# Patient Record
Sex: Female | Born: 1935 | Race: White | Hispanic: No
Health system: Southern US, Community
[De-identification: ages and names within clinical notes are randomized; demographics above are authoritative.]

## PROBLEM LIST (undated history)

## (undated) DIAGNOSIS — E119 Type 2 diabetes mellitus without complications: Secondary | ICD-10-CM

## (undated) DIAGNOSIS — I10 Essential (primary) hypertension: Secondary | ICD-10-CM

---

## 2021-07-15 ENCOUNTER — Inpatient Hospital Stay (HOSPITAL_COMMUNITY)
Admission: EM | Admit: 2021-07-15 | Discharge: 2021-07-19 | DRG: 682 | Disposition: A | Discharge status: Nursing Home (Includes ICF) | Payer: Medicare Other | Source: Skilled Nursing Facility | Attending: Internal Medicine | Admitting: Internal Medicine

## 2021-07-15 ENCOUNTER — Emergency Department (HOSPITAL_COMMUNITY): Payer: Medicare Other

## 2021-07-15 ENCOUNTER — Other Ambulatory Visit: Payer: Self-pay

## 2021-07-15 ENCOUNTER — Encounter (HOSPITAL_COMMUNITY): Payer: Self-pay | Admitting: Emergency Medicine

## 2021-07-15 DIAGNOSIS — I5032 Chronic diastolic (congestive) heart failure: Secondary | ICD-10-CM | POA: Diagnosis present

## 2021-07-15 DIAGNOSIS — F32A Depression, unspecified: Secondary | ICD-10-CM | POA: Diagnosis present

## 2021-07-15 DIAGNOSIS — N179 Acute kidney failure, unspecified: Secondary | ICD-10-CM | POA: Diagnosis present

## 2021-07-15 DIAGNOSIS — I872 Venous insufficiency (chronic) (peripheral): Secondary | ICD-10-CM | POA: Diagnosis present

## 2021-07-15 DIAGNOSIS — Z20822 Contact with and (suspected) exposure to covid-19: Secondary | ICD-10-CM | POA: Diagnosis present

## 2021-07-15 DIAGNOSIS — H9190 Unspecified hearing loss, unspecified ear: Secondary | ICD-10-CM | POA: Diagnosis present

## 2021-07-15 DIAGNOSIS — I130 Hypertensive heart and chronic kidney disease with heart failure and stage 1 through stage 4 chronic kidney disease, or unspecified chronic kidney disease: Secondary | ICD-10-CM | POA: Diagnosis present

## 2021-07-15 DIAGNOSIS — F419 Anxiety disorder, unspecified: Secondary | ICD-10-CM | POA: Diagnosis present

## 2021-07-15 DIAGNOSIS — I4819 Other persistent atrial fibrillation: Secondary | ICD-10-CM | POA: Diagnosis present

## 2021-07-15 DIAGNOSIS — E1122 Type 2 diabetes mellitus with diabetic chronic kidney disease: Secondary | ICD-10-CM | POA: Diagnosis present

## 2021-07-15 DIAGNOSIS — B961 Klebsiella pneumoniae [K. pneumoniae] as the cause of diseases classified elsewhere: Secondary | ICD-10-CM | POA: Diagnosis present

## 2021-07-15 DIAGNOSIS — Z993 Dependence on wheelchair: Secondary | ICD-10-CM | POA: Diagnosis not present

## 2021-07-15 DIAGNOSIS — E8809 Other disorders of plasma-protein metabolism, not elsewhere classified: Secondary | ICD-10-CM | POA: Diagnosis present

## 2021-07-15 DIAGNOSIS — G9341 Metabolic encephalopathy: Secondary | ICD-10-CM | POA: Diagnosis present

## 2021-07-15 DIAGNOSIS — D631 Anemia in chronic kidney disease: Secondary | ICD-10-CM | POA: Diagnosis present

## 2021-07-15 DIAGNOSIS — D696 Thrombocytopenia, unspecified: Secondary | ICD-10-CM | POA: Diagnosis present

## 2021-07-15 DIAGNOSIS — R531 Weakness: Secondary | ICD-10-CM | POA: Diagnosis present

## 2021-07-15 DIAGNOSIS — Z79899 Other long term (current) drug therapy: Secondary | ICD-10-CM | POA: Diagnosis not present

## 2021-07-15 DIAGNOSIS — N189 Chronic kidney disease, unspecified: Secondary | ICD-10-CM | POA: Diagnosis present

## 2021-07-15 DIAGNOSIS — I4891 Unspecified atrial fibrillation: Secondary | ICD-10-CM | POA: Diagnosis not present

## 2021-07-15 DIAGNOSIS — N390 Urinary tract infection, site not specified: Secondary | ICD-10-CM | POA: Diagnosis present

## 2021-07-15 DIAGNOSIS — R778 Other specified abnormalities of plasma proteins: Principal | ICD-10-CM

## 2021-07-15 HISTORY — DX: Type 2 diabetes mellitus without complications: E11.9

## 2021-07-15 HISTORY — DX: Essential (primary) hypertension: I10

## 2021-07-15 LAB — URINALYSIS, ROUTINE W REFLEX MICROSCOPIC
Bilirubin Urine: NEGATIVE
Glucose, UA: NEGATIVE mg/dL
Ketones, ur: NEGATIVE mg/dL
Nitrite: NEGATIVE
Protein, ur: NEGATIVE mg/dL
Specific Gravity, Urine: 1.008 (ref 1.005–1.030)
WBC, UA: >50 WBC/hpf — ABNORMAL HIGH (ref 0–5)
pH: 7 (ref 5.0–8.0)

## 2021-07-15 LAB — CBC WITH DIFFERENTIAL/PLATELET
Abs Immature Granulocytes: 0.04 10*3/uL (ref 0.00–0.07)
Basophils Absolute: 0.1 10*3/uL (ref 0.0–0.1)
Basophils Relative: 1 %
Eosinophils Absolute: 0.2 10*3/uL (ref 0.0–0.5)
Eosinophils Relative: 2 %
HCT: 34.5 % — ABNORMAL LOW (ref 36.0–46.0)
Hemoglobin: 11.2 g/dL — ABNORMAL LOW (ref 12.0–15.0)
Immature Granulocytes: 1 %
Lymphocytes Relative: 15 %
Lymphs Abs: 1.2 10*3/uL (ref 0.7–4.0)
MCH: 28 pg (ref 26.0–34.0)
MCHC: 32.5 g/dL (ref 30.0–36.0)
MCV: 86.3 fL (ref 80.0–100.0)
Monocytes Absolute: 0.8 10*3/uL (ref 0.1–1.0)
Monocytes Relative: 10 %
Neutro Abs: 6.2 10*3/uL (ref 1.7–7.7)
Neutrophils Relative %: 71 %
Platelets: 145 10*3/uL — ABNORMAL LOW (ref 150–400)
RBC: 4 MIL/uL (ref 3.87–5.11)
RDW: 14 % (ref 11.5–15.5)
WBC: 8.4 10*3/uL (ref 4.0–10.5)
nRBC: 0 % (ref 0.0–0.2)

## 2021-07-15 LAB — TROPONIN I (HIGH SENSITIVITY)
Troponin I (High Sensitivity): 49 ng/L — ABNORMAL HIGH (ref <18)
Troponin I (High Sensitivity): 47 ng/L — ABNORMAL HIGH (ref <18)

## 2021-07-15 LAB — CBG MONITORING, ED: Glucose-Capillary: 170 mg/dL — ABNORMAL HIGH (ref 70–99)

## 2021-07-15 LAB — COMPREHENSIVE METABOLIC PANEL
ALT: 11 U/L (ref 0–44)
AST: 15 U/L (ref 15–41)
Albumin: 2.9 g/dL — ABNORMAL LOW (ref 3.5–5.0)
Alkaline Phosphatase: 67 U/L (ref 38–126)
Anion gap: 13 (ref 5–15)
BUN: 80 mg/dL — ABNORMAL HIGH (ref 8–23)
CO2: 23 mmol/L (ref 22–32)
Calcium: 10.1 mg/dL (ref 8.9–10.3)
Chloride: 101 mmol/L (ref 98–111)
Creatinine, Ser: 4.35 mg/dL — ABNORMAL HIGH (ref 0.44–1.00)
GFR, Estimated: 9 mL/min — ABNORMAL LOW (ref >60)
Glucose, Bld: 243 mg/dL — ABNORMAL HIGH (ref 70–99)
Potassium: 4.4 mmol/L (ref 3.5–5.1)
Sodium: 137 mmol/L (ref 135–145)
Total Bilirubin: 1.2 mg/dL (ref 0.3–1.2)
Total Protein: 6.1 g/dL — ABNORMAL LOW (ref 6.5–8.1)

## 2021-07-15 LAB — HEMOGLOBIN A1C
Hgb A1c MFr Bld: 7.4 % — ABNORMAL HIGH (ref 4.8–5.6)
Mean Plasma Glucose: 165.68 mg/dL

## 2021-07-15 LAB — LIPASE, BLOOD: Lipase: 29 U/L (ref 11–51)

## 2021-07-15 LAB — SARS CORONAVIRUS 2 BY RT PCR: SARS Coronavirus 2 by RT PCR: NEGATIVE

## 2021-07-15 LAB — LACTIC ACID, PLASMA: Lactic Acid, Venous: 1.8 mmol/L (ref 0.5–1.9)

## 2021-07-15 LAB — GLUCOSE, CAPILLARY: Glucose-Capillary: 154 mg/dL — ABNORMAL HIGH (ref 70–99)

## 2021-07-15 MED ORDER — INSULIN ASPART 100 UNIT/ML IJ SOLN: 0.0000 [IU] | Freq: Three times a day (TID) | INTRAMUSCULAR | Status: DC

## 2021-07-15 MED ORDER — INSULIN ASPART 100 UNIT/ML IJ SOLN
0.0000 [IU] | INTRAMUSCULAR | Status: DC
  Administered 2021-07-15 – 2021-07-16 (×4): 1 [IU] via SUBCUTANEOUS
  Administered 2021-07-17: 3 [IU] via SUBCUTANEOUS
  Administered 2021-07-17 (×3): 1 [IU] via SUBCUTANEOUS
  Administered 2021-07-18: 2 [IU] via SUBCUTANEOUS
  Administered 2021-07-18 – 2021-07-19 (×6): 1 [IU] via SUBCUTANEOUS

## 2021-07-15 MED ORDER — SODIUM CHLORIDE 0.9 % IV BOLUS
500.0000 mL | Freq: Once | INTRAVENOUS | Status: AC
  Administered 2021-07-15: 500 mL via INTRAVENOUS

## 2021-07-15 NOTE — ED Notes (Signed)
Unable to obtain UA at this time. Patient in hallway and incontinent of bladder.

## 2021-07-15 NOTE — H&P (Cosign Needed)
Date: 07/16/2021               Patient Name:  Laura George MRN: [id number]  DOB: July 01, 1925 Age / Sex: 88 y.o., female   PCP: Aviva Kluver, FNP         Medical Service: Internal Medicine Teaching Service         Attending Physician: Dr. Reymundo Poll, MD    First Contact: Rudene Christians, DO Pager: [phone]  Second Contact: Salena Saner, MD Pager: Turner Daniels [phone]       After Hours (After 5p/  First Contact Pager: [phone]  weekends / holidays): Second Contact Pager: [phone]   SUBJECTIVE  Chief Complaint: generalized weakness  History of Present Illness: Laura George is a 88 y.o. female with a pertinent PMH of Heart failure, hypertension, type 2 diabetes mellitus, who presents to [facility] with increase confusion and weakness.  Patient unable to answer questions. History obtained from [facility]. Unable to reach son, Laura George, but updated phone number listed in chart.  On Saturday she started have increased weakness and confusion. She was not able to feed herself. At baseline she is able to stand and walk a few steps, but is mostly wheelchair bound.   In the ED, the patient was normotensive with BP of 119/57 with MAP of 74, temperature at 97.5.  BMP showed creatinine of 4.35 with K of 4.4, BUN of 80. No leukocytosis on CBC. CT head negative for acute intracranial abnormalities.  Medications: Furosemide 40 mg BID Sertraline 25 mg qd Spironolactone 50 mg Ondansetron 8 mg q 8 hrs prn   Past Medical History:  Past Medical History:  Diagnosis Date   Diabetes mellitus without complication (HCC)    Hypertension     Social:  Lives - [facility] Support - Son, Laura George Level of function - feeds herself Substance use - non-smoker  Family History: History reviewed. No pertinent family history.  Allergies: Allergies as of 07/15/2021   (No Known Allergies)    Review of Systems: A complete ROS was negative except as per HPI.   OBJECTIVE:  Physical Exam: Blood  pressure 114/65, pulse 80, temperature 97.8 F (36.6 C), temperature source Oral, resp. rate 18, height 5\' 6"  (1.676 m), weight 63.7 kg, SpO2 98 %. Constitutional: confused, laying in bed Cardiovascular: irregularly irregular, no m/r/g Pulmonary/Chest: clear to auscultation bilaterally, normal work of breathing on room air Abdominal: decreased bowel sounds, no distension, no tenderness MSK: chronic venous stasis changes to lower extremities bilaterally, laceration below knees bilaterally from compression hose, 1+ non pitting edema to lower extremities bilaterally, extremities are warm and dry Neurological: oriented to self only Psych: hard of hearing  Pertinent Labs: CBC    Component Value Date/Time   WBC 7.0 07/16/2021 0333   RBC 4.13 07/16/2021 0333   HGB 11.4 (L) 07/16/2021 0333   HCT 35.8 (L) 07/16/2021 0333   PLT 147 (L) 07/16/2021 0333   MCV 86.7 07/16/2021 0333   MCH 27.6 07/16/2021 0333   MCHC 31.8 07/16/2021 0333   RDW 13.9 07/16/2021 0333   LYMPHSABS 1.2 07/15/2021 1440   MONOABS 0.8 07/15/2021 1440   EOSABS 0.2 07/15/2021 1440   BASOSABS 0.1 07/15/2021 1440     CMP     Component Value Date/Time   NA 138 07/16/2021 0333   K 4.7 07/16/2021 0333   CL 102 07/16/2021 0333   CO2 22 07/16/2021 0333   GLUCOSE 128 (H) 07/16/2021 0333   BUN 78 (H) 07/16/2021 07/18/2021  CREATININE 3.93 (H) 07/16/2021 0333   CALCIUM 10.2 07/16/2021 0333   PROT 6.1 (L) 07/15/2021 1440   ALBUMIN 2.9 (L) 07/15/2021 1440   AST 15 07/15/2021 1440   ALT 11 07/15/2021 1440   ALKPHOS 67 07/15/2021 1440   BILITOT 1.2 07/15/2021 1440   GFRNONAA 10 (L) 07/16/2021 0333    Pertinent Imaging: ECHOCARDIOGRAM COMPLETE  Result Date: 07/16/2021    ECHOCARDIOGRAM REPORT   Patient Name:   Laura George Date of Exam: 07/16/2021 Medical Rec #:  [id number]     Height:       66.0 in Accession #:    [id number]    Weight:       140.4 lb Date of Birth:  10-15-1925     BSA:          1.721 m Patient Age:    86 years       BP:           114/65 mmHg Patient Gender: F             HR:           75 bpm. Exam Location:  Inpatient Procedure: 2D Echo, Cardiac Doppler, Color Doppler and Intracardiac            Opacification Agent Indications:    Atrial fibrillation  History:        Patient has no prior history of Echocardiogram examinations.                 Risk Factors:Hypertension and Diabetes.  Sonographer:    Clayton Lefort RDCS (AE) Referring Phys: [id number] Velna Ochs  Sonographer Comments: Image acquisition challenging due to respiratory motion. IMPRESSIONS  1. Left ventricular ejection fraction, by estimation, is 50 to 55%. The left ventricle has low normal function. The left ventricle demonstrates regional wall motion abnormalities (abnormal septal motion). There is mild concentric left ventricular hypertrophy. Left ventricular diastolic function could not be evaluated.  2. Right ventricular systolic function is normal. The right ventricular size is normal. There is normal pulmonary artery systolic pressure.  3. The mitral valve is grossly normal. No evidence of mitral valve regurgitation. The mean mitral valve gradient is 3.0 mmHg with average heart rate of 91 bpm.  4. The aortic valve was not well visualized. Aortic valve regurgitation is not visualized. No aortic stenosis is present.  5. The inferior vena cava is normal in size with greater than 50% respiratory variability, suggesting right atrial pressure of 3 mmHg. Comparison(s): No prior Echocardiogram. FINDINGS  Left Ventricle: Left ventricular ejection fraction, by estimation, is 50 to 55%. The left ventricle has low normal function. The left ventricle demonstrates regional wall motion abnormalities. Definity contrast agent was given IV to delineate the left ventricular endocardial borders. The left ventricular internal cavity size was small. There is mild concentric left ventricular hypertrophy. Left ventricular diastolic function could not be evaluated due to atrial  fibrillation. Left ventricular diastolic  function could not be evaluated. Right Ventricle: The right ventricular size is normal. Right vetricular wall thickness was not well visualized. Right ventricular systolic function is normal. There is normal pulmonary artery systolic pressure. The tricuspid regurgitant velocity is 2.39 m/s, and with an assumed right atrial pressure of 3 mmHg, the estimated right ventricular systolic pressure is 0000000 mmHg. Left Atrium: Left atrial size was normal in size. Right Atrium: Right atrial size was normal in size. Pericardium: Trivial pericardial effusion is present. Presence of epicardial fat layer. Mitral Valve: The mitral valve is  grossly normal. No evidence of mitral valve regurgitation. The mean mitral valve gradient is 3.0 mmHg with average heart rate of 91 bpm. Tricuspid Valve: The tricuspid valve is not well visualized. Tricuspid valve regurgitation is trivial. Aortic Valve: The aortic valve was not well visualized. Aortic valve regurgitation is not visualized. No aortic stenosis is present. Aortic valve mean gradient measures 2.0 mmHg. Aortic valve peak gradient measures 3.6 mmHg. Aortic valve area, by VTI measures 2.32 cm. Pulmonic Valve: The pulmonic valve was not well visualized. Pulmonic valve regurgitation is not visualized. Aorta: The aortic root and ascending aorta are structurally normal, with no evidence of dilitation. Venous: The inferior vena cava is normal in size with greater than 50% respiratory variability, suggesting right atrial pressure of 3 mmHg. IAS/Shunts: The atrial septum is grossly normal.  LEFT VENTRICLE PLAX 2D LVIDd:         3.50 cm LVIDs:         2.40 cm LV PW:         1.20 cm LV IVS:        1.30 cm LVOT diam:     1.90 cm LV SV:         45 LV SV Index:   26 LVOT Area:     2.84 cm  LV Volumes (MOD) LV vol d, MOD A2C: 63.1 ml LV vol d, MOD A4C: 64.3 ml LV vol s, MOD A2C: 24.3 ml LV vol s, MOD A4C: 38.7 ml LV SV MOD A2C:     38.8 ml LV SV MOD A4C:      64.3 ml LV SV MOD BP:      33.2 ml RIGHT VENTRICLE            IVC RV Basal diam:  2.70 cm    IVC diam: 1.80 cm RV S prime:     8.27 cm/s TAPSE (M-mode): 1.3 cm LEFT ATRIUM             Index        RIGHT ATRIUM           Index LA diam:        2.80 cm 1.63 cm/m   RA Area:     12.40 cm LA Vol (A2C):   15.0 ml 8.72 ml/m   RA Volume:   26.00 ml  15.11 ml/m LA Vol (A4C):   37.4 ml 21.74 ml/m LA Biplane Vol: 23.4 ml 13.60 ml/m  AORTIC VALVE AV Area (Vmax):    2.57 cm AV Area (Vmean):   2.71 cm AV Area (VTI):     2.32 cm AV Vmax:           94.50 cm/s AV Vmean:          65.500 cm/s AV VTI:            0.193 m AV Peak Grad:      3.6 mmHg AV Mean Grad:      2.0 mmHg LVOT Vmax:         85.70 cm/s LVOT Vmean:        62.600 cm/s LVOT VTI:          0.158 m LVOT/AV VTI ratio: 0.82  AORTA Ao Root diam: 2.90 cm Ao Asc diam:  2.60 cm MITRAL VALVE           TRICUSPID VALVE MV Mean grad: 3.0 mmHg TR Peak grad:   22.8 mmHg  TR Vmax:        239.00 cm/s                         SHUNTS                        Systemic VTI:  0.16 m                        Systemic Diam: 1.90 cm Rudean Haskell MD Electronically signed by Rudean Haskell MD Signature Date/Time: 07/16/2021/12:10:31 PM    Final    US RENAL  Result Date: 07/15/2021 CLINICAL DATA:  Acute kidney failure. EXAM: RENAL / URINARY TRACT ULTRASOUND COMPLETE COMPARISON:  None Available. FINDINGS: Right Kidney: Renal measurements: 8 x 4.6 x 4 5 cm = volume: 87 mL. Diffuse increased echotexture of the kidney. No mass or hydronephrosis visualized. Perinephric fluid is noted. Left Kidney: Renal measurements: 9.4 x 5 x 4.7 cm = volume: 115 mL. Diffuse increased echotexture of the kidney. No hydronephrosis visualized. There are less than 10 simple cysts identified in the left kidney, largest measures 2.6 x 2.2 x 2.7 cm in the lower pole left kidney. No follow-up is necessary. Perinephric fluid is noted. Bladder: Appears normal for degree of bladder  distention. Other: None. IMPRESSION: No acute abnormality. No hydronephrosis bilaterally. Diffuse increased echotexture of the kidney this can be seen in medical renal disease. Electronically Signed   By: Abelardo Diesel M.D.   On: 07/15/2021 17:22    EKG: personally reviewed my interpretation is irregularly irregular, LAD with LBBB and J point elevation in lead V2 and 3  ASSESSMENT & PLAN:  Assessment: Principal Problem:   AKI (acute kidney injury) ([place]) Active Problems:   Acute metabolic encephalopathy   Laura George is a 88 y.o. with pertinent PMH of type 2 diabetes mellitus, hypertension, heart failure, and depression/ anxiety who presented with generalized weakness and admit for AKI on hospital day 1  Plan:  Acute metabolic encephalopathy Uremia Patient presents with increased confusion. At baseline she is oriented to self, place, able to feed oneself, and is able to hold a conversation. BMP showed creatinine of 4.35 with BUN of 80 with anion gap of 13. Blood sugar elevated at 243. Uremia vs hyperglycemia could be contributing to increased confusion. Patient is not able to convey symptoms, it is possible that she has a bladder infection, but has not been febrile here and does not have leukocytosis. CT head was negative for acute intracranial process but did show chronic small vessel ischemic disease and brain atrophy. The only centrally acting medication on her list is sertraline 25 mg. I think that encephalopathy is likely multifactorial in setting of uremia and possible urinary retention. -bladder scan -UA -hold sertraline -delirium precautions  AKI on CKD, unknown baseline Patient presenting with several day history of generalized weakness and confusion. On exam, she appears euvolemic. Labs showed creatinine of 4.35 with BUN of 80 with anion gap of 13. GFR of 9. Renal ultrasound showed no hydronephrosis bilaterally with diffuse increased echotexture consistent with chronic  disease. -trend BMP -UA pending -avoid nephrotoxins - if post void residual >400 then I and O.  Atrial fibrillation with controlled rates Elevated troponin EKG showed atrial fibrillation with left axis deviation and LBBB. Troponin 49->47. CHADVASC score of 6 points, HASBLED score of 2. -echo pending -telemetry -consider starting on anticoagulation  Heart failure with unknown EF Home  medications include furosemide 40 mg BID and spironolactone. I think that she likely has diastolic dysfunction, but will check echo. She appears euvolemic on exam.  -daily weights -strict I and Os  Normocytic anemia Hgb at 11.2 with MCV of 86.3 -iron studies -ferritin  Thrombocytopenia, mild Platelets at 145.  -trend CBC  Type 2 diabetes mellitus Glucose elevated at 243, anion gap of 13. I think that anion gap is likely related to uremia. -hgba1c pending -SSI, very sensitive  Hypoalbuminemia Albumin of 2.9.  Hypertension Home medication include spironolactone 50 mg qd. Blood pressure is normotensive. Will hold BP medications for now.  Anxiety/ depression Home medication includes sertraline 25 mg qd. -Holding in setting of AKI and confusion  Chronic venous insufficiency Physical exam showed chronic venous stasis changes. Compression stocking appears to have caused a pressure injury to area below knees bilaterally. -monitor for signs of infection at pressure injury  Best Practice: Diet: Diabetic diet IVF: Fluids: none VTE: apixaban (ELIQUIS) tablet 2.5 mg Start: 07/16/21 2200 SCDs Start: 07/15/21 1644 Code: Full AB: none Status: Inpatient with expected length of stay greater than 2 midnights. Anticipated Discharge Location: pending Barriers to Discharge: improvement in AKI and confusion  Signature: Laren Whaling M. Ricard Faulkner, D.O.  Internal Medicine Resident, PGY-1 [facility]  Pager: [phone] 3:56 PM, 07/16/2021   Please contact the on call pager after 5  pm and on weekends at [phone].

## 2021-07-15 NOTE — ED Notes (Signed)
ED TO INPATIENT HANDOFF REPORT  ED Nurse Name and Phone #: [phone] [facility] RN  S Name/Age/Gender Laura George 88 y.o. female Room/Bed: 020C/020C  Code Status   Code Status: Full Code  Home/SNF/Other Nursing Home Patient oriented to: self and place Is this baseline? Yes   Triage Complete: Triage complete  Chief Complaint AKI (acute kidney injury) (HCC) [N17.9]  Triage Note Pt BIB [facility] from [facility] for generalized weakness x2 days. Staff reports she has had more issues getting around recently. Pt denies complaints. 12 lead shows afib/aflutter, no hx of same. EMS reports pts mental status is at baseline, pt A/O x2 on assessment, oriented to person and place.  128/60, HR 76, 95% RA, CBG 186   Allergies Not on File  Level of Care/Admitting Diagnosis ED Disposition     ED Disposition  Admit   Condition  --   Comment  Hospital Area: [facility] [[id number]]  Level of Care: Telemetry Medical [[id number]]  May admit patient to [facility] or [facility] if equivalent level of care is available:: No  Covid Evaluation: Asymptomatic - no recent exposure (last 10 days) testing not required  Diagnosis: AKI (acute kidney injury) [facility]) [[id number]]  Admitting Physician: Reymundo Poll [[id number]]  Attending Physician: Reymundo Poll [[id number]]  Estimated length of stay: past midnight tomorrow  Certification:: I certify this patient will need inpatient services for at least 2 midnights          B Medical/Surgery History Past Medical History:  Diagnosis Date   Diabetes mellitus without complication (HCC)    Hypertension    History reviewed. No pertinent surgical history.   A IV Location/Drains/Wounds Patient Lines/Drains/Airways Status     Active Line/Drains/Airways     Name Placement date Placement time Site Days   Peripheral IV 07/15/21 20 G Left Antecubital 07/15/21  1604  Antecubital  less than 1            Intake/Output Last 24 hours No  intake or output data in the 24 hours ending 07/15/21 2055  Labs/Imaging Results for orders placed or performed during the hospital encounter of 07/15/21 (from the past 48 hour(s))  SARS Coronavirus 2 by RT PCR (hospital order, performed in [facility] hospital lab) *cepheid single result test* Anterior Nasal Swab     Status: None   Collection Time: 07/15/21 12:59 PM   Specimen: Anterior Nasal Swab  Result Value Ref Range   SARS Coronavirus 2 by RT PCR NEGATIVE NEGATIVE    Comment: (NOTE) SARS-CoV-2 target nucleic acids are NOT DETECTED.  The SARS-CoV-2 RNA is generally detectable in upper and lower respiratory specimens during the acute phase of infection. The lowest concentration of SARS-CoV-2 viral copies this assay can detect is 250 copies / mL. A negative result does not preclude SARS-CoV-2 infection and should not be used as the sole basis for treatment or other patient management decisions.  A negative result may occur with improper specimen collection / handling, submission of specimen other than nasopharyngeal swab, presence of viral mutation(s) within the areas targeted by this assay, and inadequate number of viral copies (<250 copies / mL). A negative result must be combined with clinical observations, patient history, and epidemiological information.  Fact Sheet for Patients:   RoadLapTop.co.za  Fact Sheet for Healthcare Providers: http://kim-miller.com/  This test is not yet approved or  cleared by the [place] FDA and has been authorized for detection and/or diagnosis of SARS-CoV-2 by FDA under an Emergency Use Authorization (EUA).  This EUA will remain in effect (meaning this test can be used) for the duration of the COVID-19 declaration under Section 564(b)(1) of the Act, 21 U.S.C. section 360bbb-3(b)(1), unless the authorization is terminated or revoked sooner.  Performed at [facility], [address] [place], [place] [address]   CBC with Differential     Status: Abnormal   Collection Time: 07/15/21  2:40 PM  Result Value Ref Range   WBC 8.4 4.0 - 10.5 K/uL   RBC 4.00 3.87 - 5.11 MIL/uL   Hemoglobin 11.2 (L) 12.0 - 15.0 g/dL   HCT 53.9 (L) 76.7 - 34.1 %   MCV 86.3 80.0 - 100.0 fL   MCH 28.0 26.0 - 34.0 pg   MCHC 32.5 30.0 - 36.0 g/dL   RDW 93.7 90.2 - 40.9 %   Platelets 145 (L) 150 - 400 K/uL   nRBC 0.0 0.0 - 0.2 %   Neutrophils Relative % 71 %   Neutro Abs 6.2 1.7 - 7.7 K/uL   Lymphocytes Relative 15 %   Lymphs Abs 1.2 0.7 - 4.0 K/uL   Monocytes Relative 10 %   Monocytes Absolute 0.8 0.1 - 1.0 K/uL   Eosinophils Relative 2 %   Eosinophils Absolute 0.2 0.0 - 0.5 K/uL   Basophils Relative 1 %   Basophils Absolute 0.1 0.0 - 0.1 K/uL   Immature Granulocytes 1 %   Abs Immature Granulocytes 0.04 0.00 - 0.07 K/uL    Comment: Performed at [facility], [address] [place], [place] [address]  Comprehensive metabolic panel     Status: Abnormal   Collection Time: 07/15/21  2:40 PM  Result Value Ref Range   Sodium 137 135 - 145 mmol/L   Potassium 4.4 3.5 - 5.1 mmol/L   Chloride 101 98 - 111 mmol/L   CO2 23 22 - 32 mmol/L   Glucose, Bld 243 (H) 70 - 99 mg/dL    Comment: Glucose reference range applies only to samples taken after fasting for at least 8 hours.   BUN 80 (H) 8 - 23 mg/dL   Creatinine, Ser 9.92 (H) 0.44 - 1.00 mg/dL   Calcium 42.6 8.9 - 83.4 mg/dL   Total Protein 6.1 (L) 6.5 - 8.1 g/dL   Albumin 2.9 (L) 3.5 - 5.0 g/dL   AST 15 15 - 41 U/L   ALT 11 0 - 44 U/L   Alkaline Phosphatase 67 38 - 126 U/L   Total Bilirubin 1.2 0.3 - 1.2 mg/dL   GFR, Estimated 9 (L) >60 mL/min    Comment: (NOTE) Calculated using the CKD-EPI Creatinine Equation (2021)    Anion gap 13 5 - 15    Comment: Performed at [facility], [address], [place], [place] [address]  Troponin I (High Sensitivity)     Status: Abnormal   Collection Time: 07/15/21  2:40 PM  Result Value  Ref Range   Troponin I (High Sensitivity) 49 (H) <18 ng/L    Comment: (NOTE) Elevated high sensitivity troponin I (hsTnI) values and significant  changes across serial measurements may suggest ACS but many other  chronic and acute conditions are known to elevate hsTnI results.  Refer to the "Links" section for chest pain algorithms and additional  guidance. Performed at [facility], [address] [place], [place] [address]   Lipase, blood     Status: None   Collection Time: 07/15/21  2:40 PM  Result Value Ref Range  Lipase 29 11 - 51 U/L    Comment: Performed at [facility], [address], [place], [place] [address]  Lactic acid, plasma     Status: None   Collection Time: 07/15/21  2:40 PM  Result Value Ref Range   Lactic Acid, Venous 1.8 0.5 - 1.9 mmol/L    Comment: Performed at [facility], [address], [place], [place] [address]  Troponin I (High Sensitivity)     Status: Abnormal   Collection Time: 07/15/21  4:47 PM  Result Value Ref Range   Troponin I (High Sensitivity) 47 (H) <18 ng/L    Comment: (NOTE) Elevated high sensitivity troponin I (hsTnI) values and significant  changes across serial measurements may suggest ACS but many other  chronic and acute conditions are known to elevate hsTnI results.  Refer to the "Links" section for chest pain algorithms and additional  guidance. Performed at [facility], [address], [place], [place] [address]   CBG monitoring, ED     Status: Abnormal   Collection Time: 07/15/21  7:27 PM  Result Value Ref Range   Glucose-Capillary 170 (H) 70 - 99 mg/dL    Comment: Glucose reference range applies only to samples taken after fasting for at least 8 hours.  Urinalysis, Routine w reflex microscopic Urine, Clean Catch     Status: Abnormal   Collection Time: 07/15/21  7:39 PM  Result Value Ref Range   Color, Urine YELLOW YELLOW   APPearance CLOUDY (A) CLEAR   Specific Gravity, Urine 1.008 1.005 - 1.030    pH 7.0 5.0 - 8.0   Glucose, UA NEGATIVE NEGATIVE mg/dL   Hgb urine dipstick SMALL (A) NEGATIVE   Bilirubin Urine NEGATIVE NEGATIVE   Ketones, ur NEGATIVE NEGATIVE mg/dL   Protein, ur NEGATIVE NEGATIVE mg/dL   Nitrite NEGATIVE NEGATIVE   Leukocytes,Ua LARGE (A) NEGATIVE   RBC / HPF 0-5 0 - 5 RBC/hpf   WBC, UA >50 (H) 0 - 5 WBC/hpf   Bacteria, UA MANY (A) NONE SEEN   Squamous Epithelial / LPF 0-5 0 - 5   WBC Clumps PRESENT    Mucus PRESENT    Budding Yeast PRESENT    Hyaline Casts, UA PRESENT     Comment: Performed at [facility], [address], [place], [place] [address]   US RENAL  Result Date: 07/15/2021 CLINICAL DATA:  Acute kidney failure. EXAM: RENAL / URINARY TRACT ULTRASOUND COMPLETE COMPARISON:  None Available. FINDINGS: Right Kidney: Renal measurements: 8 x 4.6 x 4 5 cm = volume: 87 mL. Diffuse increased echotexture of the kidney. No mass or hydronephrosis visualized. Perinephric fluid is noted. Left Kidney: Renal measurements: 9.4 x 5 x 4.7 cm = volume: 115 mL. Diffuse increased echotexture of the kidney. No hydronephrosis visualized. There are less than 10 simple cysts identified in the left kidney, largest measures 2.6 x 2.2 x 2.7 cm in the lower pole left kidney. No follow-up is necessary. Perinephric fluid is noted. Bladder: Appears normal for degree of bladder distention. Other: None. IMPRESSION: No acute abnormality. No hydronephrosis bilaterally. Diffuse increased echotexture of the kidney this can be seen in medical renal disease. Electronically Signed   By: Sherian Rein M.D.   On: 07/15/2021 17:22   CT Head Wo Contrast  Result Date: 07/15/2021 CLINICAL DATA:  Altered mental status. EXAM: CT HEAD WITHOUT CONTRAST TECHNIQUE: Contiguous axial images were obtained from the base of the skull through the vertex without intravenous  contrast. RADIATION DOSE REDUCTION: This exam was performed according to the departmental dose-optimization program which includes automated  exposure control, adjustment of the mA and/or kV according to patient size and/or use of iterative reconstruction technique. COMPARISON:  None Available. FINDINGS: Brain: No evidence of acute infarction, hemorrhage, hydrocephalus, extra-axial collection or mass lesion/mass effect. There is mild diffuse low-attenuation within the subcortical and periventricular white matter compatible with chronic microvascular disease. Prominence of the sulci and ventricles compatible with brain atrophy. Vascular: No hyperdense vessel or unexpected calcification. Skull: Normal. Negative for fracture or focal lesion. Sinuses/Orbits: No acute finding. Other: None IMPRESSION: 1. No acute intracranial abnormalities. 2. Chronic small vessel ischemic disease and brain atrophy. Electronically Signed   By: Signa Kell M.D.   On: 07/15/2021 14:20   DG Chest 2 View  Result Date: 07/15/2021 CLINICAL DATA:  Chest pain. EXAM: CHEST - 2 VIEW COMPARISON:  None Available. FINDINGS: The heart size and mediastinal contours are within normal limits. Both lungs are clear. The visualized skeletal structures are unremarkable. IMPRESSION: No active cardiopulmonary disease. Aortic Atherosclerosis (ICD10-I70.0). Electronically Signed   By: Lupita Raider M.D.   On: 07/15/2021 13:23    Pending Labs Unresulted Labs (From admission, onward)     Start     Ordered   07/16/21 0500  Basic metabolic panel  Tomorrow morning,   R        07/15/21 1651   07/16/21 0500  CBC  Tomorrow morning,   R        07/15/21 1651   07/15/21 1841  Iron and TIBC  Tomorrow morning,   R        07/15/21 1840   07/15/21 1841  Ferritin  Tomorrow morning,   R        07/15/21 1840   07/15/21 1811  Hemoglobin A1c  Add-on,   AD       Comments: To assess prior glycemic control    07/15/21 1811   07/15/21 1259  Blood culture (routine x 2)  BLOOD CULTURE X 2,   R      07/15/21 1259   07/15/21 1259  Lactic acid, plasma  Now then every 2 hours,   R      07/15/21 1259    07/15/21 1259  Urine Culture  Once,   URGENT       Question:  Indication  Answer:  Altered mental status (if no other cause identified)   07/15/21 1259            Vitals/Pain Today's Vitals   07/15/21 1210 07/15/21 1540 07/15/21 1959 07/15/21 2000  BP: (!) 119/57 127/70  (!) 130/58  Pulse: 84 80 (!) 57 68  Resp: 14 16 12 17   Temp:  (!) 97.5 F (36.4 C)    TempSrc:  Axillary    SpO2: 95% 97% 98% 98%  PainSc:        Isolation Precautions Airborne and Contact precautions  Medications Medications  insulin aspart (novoLOG) injection 0-6 Units (1 Units Subcutaneous Given 07/15/21 1929)  sodium chloride 0.9 % bolus 500 mL (0 mLs Intravenous Stopped 07/15/21 1929)    Mobility walks with device High fall risk   Focused Assessments Pulmonary Assessment Handoff:  Lung sounds:   O2 Device: Room Air      R Recommendations: See Admitting Provider Note  Report given to:   Additional Notes:

## 2021-07-15 NOTE — ED Triage Notes (Addendum)
Pt BIB [facility] from [facility] for generalized weakness x2 days. Staff reports she has had more issues getting around recently. Pt denies complaints. 12 lead shows afib/aflutter, no hx of same. EMS reports pts mental status is at baseline, pt A/O x2 on assessment, oriented to person and place.  128/60, HR 76, 95% RA, CBG 186

## 2021-07-15 NOTE — ED Notes (Signed)
Bladder scan reveals <61mL or no detectable volume after several scans.

## 2021-07-15 NOTE — ED Provider Notes (Signed)
[facility] EMERGENCY DEPARTMENT Provider Note   CSN: [id number] Arrival date & time: 07/15/21  1149     History {Add pertinent medical, surgical, social history, OB history to HPI:1} Chief Complaint  Patient presents with   Weakness    Laura George is a 88 y.o. female.  HPI      88yo female with a history of hypertension, DM, *** who presents with concern for generalized weakness for 2 days.    Came from [facility]  She reports she is not sure why she is here;  Since Saturday morning has seemed weak, to the poin this morning not feeding herself.   Friday she went to 2 activities Saturday morning weak, not feeding self, not eating or drinking, mom  Couldn't hold cup to eat or drink yesterday either, too weak, too weak to stand up. Didn't eat much.  Didn't seem to be in pain  NO nausea, vomiting, diarrhea, black or bloody stools, fever, cough, shortness of breath  Does transitions normally, feeds herself, quiet lady, normally able to answer questions, will say what she needs  No recent falls or medication changes, moved in not long ago 2 months ago, uses wheelchair   Assisted Living  Home Medications Prior to Admission medications   Not on File      Allergies    Patient has no allergy information on record.    Review of Systems   Review of Systems  Physical Exam Updated Vital Signs BP (!) 119/57 (BP Location: Right Arm)   Pulse 84   Resp 14   SpO2 95%  Physical Exam Vitals and nursing note reviewed.  Constitutional:      General: She is not in acute distress.    Appearance: She is well-developed. She is not diaphoretic.  HENT:     Head: Normocephalic and atraumatic.  Eyes:     Conjunctiva/sclera: Conjunctivae normal.  Cardiovascular:     Rate and Rhythm: Normal rate and regular rhythm.     Heart sounds: Normal heart sounds. No murmur heard.    No friction rub. No gallop.  Pulmonary:     Effort: Pulmonary effort is  normal. No respiratory distress.     Breath sounds: Normal breath sounds. No wheezing or rales.  Abdominal:     General: There is no distension.     Palpations: Abdomen is soft.     Tenderness: There is no abdominal tenderness. There is no guarding.  Musculoskeletal:        General: No tenderness.     Cervical back: Normal range of motion.  Skin:    General: Skin is warm and dry.     Findings: No erythema or rash.  Neurological:     Mental Status: She is alert and oriented to person, place, and time.     ED Results / Procedures / Treatments   Labs (all labs ordered are listed, but only abnormal results are displayed) Labs Reviewed  CULTURE, BLOOD (ROUTINE X 2)  CULTURE, BLOOD (ROUTINE X 2)  URINE CULTURE  SARS CORONAVIRUS 2 BY RT PCR  CBC WITH DIFFERENTIAL/PLATELET  COMPREHENSIVE METABOLIC PANEL  LIPASE, BLOOD  URINALYSIS, ROUTINE W REFLEX MICROSCOPIC  LACTIC ACID, PLASMA  LACTIC ACID, PLASMA  TROPONIN I (HIGH SENSITIVITY)    EKG None  Radiology No results found.  Procedures Procedures  {Document cardiac monitor, telemetry assessment procedure when appropriate:1}  Medications Ordered in ED Medications - No data to display  ED Course/ Medical Decision Making/ A&P  Medical Decision Making Amount and/or Complexity of Data Reviewed Labs: ordered. Radiology: ordered.   ***  {Document critical care time when appropriate:1} {Document review of labs and clinical decision tools ie heart score, Chads2Vasc2 etc:1}  {Document your independent review of radiology images, and any outside records:1} {Document your discussion with family members, caretakers, and with consultants:1} {Document social determinants of health affecting pt's care:1} {Document your decision making why or why not admission, treatments were needed:1} Final Clinical Impression(s) / ED Diagnoses Final diagnoses:  None    Rx / DC Orders ED Discharge Orders     None

## 2021-07-16 ENCOUNTER — Other Ambulatory Visit (HOSPITAL_COMMUNITY): Payer: Self-pay

## 2021-07-16 ENCOUNTER — Inpatient Hospital Stay (HOSPITAL_COMMUNITY): Payer: Medicare Other

## 2021-07-16 DIAGNOSIS — N179 Acute kidney failure, unspecified: Secondary | ICD-10-CM

## 2021-07-16 DIAGNOSIS — G9341 Metabolic encephalopathy: Secondary | ICD-10-CM

## 2021-07-16 DIAGNOSIS — I4891 Unspecified atrial fibrillation: Secondary | ICD-10-CM

## 2021-07-16 LAB — CBC
HCT: 35.8 % — ABNORMAL LOW (ref 36.0–46.0)
Hemoglobin: 11.4 g/dL — ABNORMAL LOW (ref 12.0–15.0)
MCH: 27.6 pg (ref 26.0–34.0)
MCHC: 31.8 g/dL (ref 30.0–36.0)
MCV: 86.7 fL (ref 80.0–100.0)
Platelets: 147 10*3/uL — ABNORMAL LOW (ref 150–400)
RBC: 4.13 MIL/uL (ref 3.87–5.11)
RDW: 13.9 % (ref 11.5–15.5)
WBC: 7 10*3/uL (ref 4.0–10.5)
nRBC: 0 % (ref 0.0–0.2)

## 2021-07-16 LAB — GLUCOSE, CAPILLARY
Glucose-Capillary: 128 mg/dL — ABNORMAL HIGH (ref 70–99)
Glucose-Capillary: 151 mg/dL — ABNORMAL HIGH (ref 70–99)
Glucose-Capillary: 145 mg/dL — ABNORMAL HIGH (ref 70–99)
Glucose-Capillary: 161 mg/dL — ABNORMAL HIGH (ref 70–99)
Glucose-Capillary: 149 mg/dL — ABNORMAL HIGH (ref 70–99)

## 2021-07-16 LAB — BASIC METABOLIC PANEL
Anion gap: 14 (ref 5–15)
BUN: 78 mg/dL — ABNORMAL HIGH (ref 8–23)
CO2: 22 mmol/L (ref 22–32)
Calcium: 10.2 mg/dL (ref 8.9–10.3)
Chloride: 102 mmol/L (ref 98–111)
Creatinine, Ser: 3.93 mg/dL — ABNORMAL HIGH (ref 0.44–1.00)
GFR, Estimated: 10 mL/min — ABNORMAL LOW (ref >60)
Glucose, Bld: 128 mg/dL — ABNORMAL HIGH (ref 70–99)
Potassium: 4.7 mmol/L (ref 3.5–5.1)
Sodium: 138 mmol/L (ref 135–145)

## 2021-07-16 LAB — ECHOCARDIOGRAM COMPLETE
AR max vel: 2.57 cm2
AV Area VTI: 2.32 cm2
AV Area mean vel: 2.71 cm2
AV Mean grad: 2 mmHg
AV Peak grad: 3.6 mmHg
Ao pk vel: 0.95 m/s
Calc EF: 51.3 %
Height: 66 in
S' Lateral: 2.4 cm
Single Plane A2C EF: 61.5 %
Single Plane A4C EF: 39.8 %
Weight: 2246.58 oz

## 2021-07-16 LAB — IRON AND TIBC
Iron: 87 ug/dL (ref 28–170)
Saturation Ratios: 30 % (ref 10.4–31.8)
TIBC: 286 ug/dL (ref 250–450)
UIBC: 199 ug/dL

## 2021-07-16 LAB — VITAMIN B12: Vitamin B-12: 1008 pg/mL — ABNORMAL HIGH (ref 180–914)

## 2021-07-16 LAB — FERRITIN: Ferritin: 99 ng/mL (ref 11–307)

## 2021-07-16 LAB — FOLATE: Folate: 9.3 ng/mL (ref >5.9)

## 2021-07-16 LAB — SODIUM, URINE, RANDOM: Sodium, Ur: 82 mmol/L

## 2021-07-16 MED ORDER — PERFLUTREN LIPID MICROSPHERE
1.0000 mL | INTRAVENOUS | Status: AC | PRN
  Administered 2021-07-16: 2 mL via INTRAVENOUS

## 2021-07-16 MED ORDER — APIXABAN 2.5 MG PO TABS
2.5000 mg | ORAL_TABLET | Freq: Two times a day (BID) | ORAL | Status: DC
  Administered 2021-07-16 – 2021-07-19 (×6): 2.5 mg via ORAL
  Filled 2021-07-16 (×6): qty 1

## 2021-07-16 MED ORDER — CHLORHEXIDINE GLUCONATE CLOTH 2 % EX PADS
6.0000 | MEDICATED_PAD | Freq: Every day | CUTANEOUS | Status: DC
Start: 2021-07-16 — End: 2021-07-19
  Administered 2021-07-16 – 2021-07-18 (×3): 6 via TOPICAL

## 2021-07-16 MED ORDER — LACTATED RINGERS IV BOLUS
1000.0000 mL | Freq: Once | INTRAVENOUS | Status: AC
  Administered 2021-07-16: 1000 mL via INTRAVENOUS

## 2021-07-16 MED ORDER — SODIUM CHLORIDE 0.9 % IV SOLN
1.0000 g | INTRAVENOUS | Status: DC
  Administered 2021-07-16 – 2021-07-18 (×3): 1 g via INTRAVENOUS
  Filled 2021-07-16 (×3): qty 10

## 2021-07-16 MED ORDER — APIXABAN 5 MG PO TABS: 5.0000 mg | ORAL_TABLET | Freq: Two times a day (BID) | ORAL | Status: DC

## 2021-07-16 NOTE — Progress Notes (Addendum)
HD#1 Subjective:  Overnight Events: none  Patient assessed at bedside this AM. She denies suprapubic tenderness. She is oriented to person and states we are in hospital, but she is unsure why she is here.  Pt is updated on the plan for today, and all questions and concerns are addressed.   Objective:  Vital signs in last 24 hours: Vitals:   07/15/21 2000 07/15/21 2200 07/15/21 2253 07/16/21 0352  BP: (!) 130/58 130/65 122/70 117/65  Pulse: 68 74 64 82  Resp: 17 11 18 18   Temp:   97.9 F (36.6 C) (!) 97.4 F (36.3 C)  TempSrc:   Oral Oral  SpO2: 98% 100% 100% 99%  Weight:   63.7 kg   Height:   5\' 6"  (1.676 m)    Supplemental O2: Room Air SpO2: 99 %   Physical Exam:  Constitutional: elderly, in no acute distress, laying in bed Cardiovascular: regular rate and rhythm, no m/r/g Pulmonary/Chest: normal work of breathing on room air, lungs clear to auscultation bilaterally Abdominal: soft, non-tender, non-distended MSK: pressure ulcer from compression stocking just below knee bilaterally without surrounding erythema, senile purpura present to lower extremities bilaterally Neurological: alert, oriented to self and place only Skin: warm and dry Psych: normal mood and affect, hard of hearing  [facility] Weights   07/15/21 2253  Weight: 63.7 kg     Intake/Output Summary (Last 24 hours) at 07/16/2021 0612 Last data filed at 07/15/2021 2249 Gross per 24 hour  Intake --  Output 350 ml  Net -350 ml   Net IO Since Admission: -350 mL [07/16/21 0612]  Pertinent Labs:    Latest Ref Rng & Units 07/16/2021    3:33 AM 07/15/2021    2:40 PM  CBC  WBC 4.0 - 10.5 K/uL 7.0  8.4   Hemoglobin 12.0 - 15.0 g/dL 07/18/2021  07/17/2021   Hematocrit 36.0 - 46.0 % 35.8  34.5   Platelets 150 - 400 K/uL 147  145        Latest Ref Rng & Units 07/16/2021    3:33 AM 07/15/2021    2:40 PM  CMP  Glucose 70 - 99 mg/dL 07/18/2021  07/17/2021   BUN 8 - 23 mg/dL 78  80   Creatinine 951 - 1.00 mg/dL 884  1.66   Sodium  0.63 - 145 mmol/L 138  137   Potassium 3.5 - 5.1 mmol/L 4.7  4.4   Chloride 98 - 111 mmol/L 102  101   CO2 22 - 32 mmol/L 22  23   Calcium 8.9 - 10.3 mg/dL 0.16  010   Total Protein 6.5 - 8.1 g/dL  6.1   Total Bilirubin 0.3 - 1.2 mg/dL  1.2   Alkaline Phos 38 - 126 U/L  67   AST 15 - 41 U/L  15   ALT 0 - 44 U/L  11     Imaging: 93.2 RENAL  Result Date: 07/15/2021 CLINICAL DATA:  Acute kidney failure. EXAM: RENAL / URINARY TRACT ULTRASOUND COMPLETE COMPARISON:  None Available. FINDINGS: Right Kidney: Renal measurements: 8 x 4.6 x 4 5 cm = volume: 87 mL. Diffuse increased echotexture of the kidney. No mass or hydronephrosis visualized. Perinephric fluid is noted. Left Kidney: Renal measurements: 9.4 x 5 x 4.7 cm = volume: 115 mL. Diffuse increased echotexture of the kidney. No hydronephrosis visualized. There are less than 10 simple cysts identified in the left kidney, largest measures 2.6 x 2.2 x 2.7 cm in the lower pole  left kidney. No follow-up is necessary. Perinephric fluid is noted. Bladder: Appears normal for degree of bladder distention. Other: None. IMPRESSION: No acute abnormality. No hydronephrosis bilaterally. Diffuse increased echotexture of the kidney this can be seen in medical renal disease. Electronically Signed   By: Sherian Rein M.D.   On: 07/15/2021 17:22   CT Head Wo Contrast  Result Date: 07/15/2021 CLINICAL DATA:  Altered mental status. EXAM: CT HEAD WITHOUT CONTRAST TECHNIQUE: Contiguous axial images were obtained from the base of the skull through the vertex without intravenous contrast. RADIATION DOSE REDUCTION: This exam was performed according to the departmental dose-optimization program which includes automated exposure control, adjustment of the mA and/or kV according to patient size and/or use of iterative reconstruction technique. COMPARISON:  None Available. FINDINGS: Brain: No evidence of acute infarction, hemorrhage, hydrocephalus, extra-axial collection or mass  lesion/mass effect. There is mild diffuse low-attenuation within the subcortical and periventricular white matter compatible with chronic microvascular disease. Prominence of the sulci and ventricles compatible with brain atrophy. Vascular: No hyperdense vessel or unexpected calcification. Skull: Normal. Negative for fracture or focal lesion. Sinuses/Orbits: No acute finding. Other: None IMPRESSION: 1. No acute intracranial abnormalities. 2. Chronic small vessel ischemic disease and brain atrophy. Electronically Signed   By: Signa Kell M.D.   On: 07/15/2021 14:20   DG Chest 2 View  Result Date: 07/15/2021 CLINICAL DATA:  Chest pain. EXAM: CHEST - 2 VIEW COMPARISON:  None Available. FINDINGS: The heart size and mediastinal contours are within normal limits. Both lungs are clear. The visualized skeletal structures are unremarkable. IMPRESSION: No active cardiopulmonary disease. Aortic Atherosclerosis (ICD10-I70.0). Electronically Signed   By: Lupita Raider M.D.   On: 07/15/2021 13:23    Assessment/Plan:   Principal Problem:   AKI (acute kidney injury) [facility])   Patient Summary: Laura George is a 88 y.o. with a pertinent PMH of heart failure, hypertension, type 2 diabetes mellitus, who presented with increased confusion and weakness and admitted on 06/19 for acute metabolic encephalopathy on HD#1.   Acute metabolic encephalopathy Uremia Urinary retention Possible UTI Unable to reach patient's son to establish what patient is like at baseline. She is more alert today and answers questions. Creatinine improved from 4.35 to 3.93 with Bun from 80 to 78. UA was + leukocytes and -for nitrites. She is not reporting symptoms but with unclear baseline and UA results will treat her for UTI. She did have urinary retention requiring in and out cath last night. Will have foley catheter placed. -urine culture pending -Ceftriaxone 1 g, Day 1 -hold sertraline -delirium precautions -foley catheter -PT/OT  eval   AKI on CKD, unknown baseline Creatinine improved from 4.35 to 3.93, with BUN 80 to 78. BUN/ creatinine ratio <20. I called and spoke with son and he states that he has been told before that her kidneys are close to failing, she does not follow with a nephrologist. -LR 1 L, she received 1L in ED -Giving additional small 500 cc bolus today -urine Na, creatinine pending -UA pending -avoid nephrotoxins - Foley catheter in place   Atrial fibrillation with controlled rates Elevated troponin EKG showed atrial fibrillation with left axis deviation and LBBB. Echo showed regional wall motion abnormalities of left ventricle, likely an old ischemic event. I called and spoke with son, who is ok with patient being started on anticoagulation for CVA prophylaxis.  -apixaban 2.5 mg BID -telemetry   Heart failure with unknown EF Echo showed EF of 50-55% with regional wall motion  abnormalities of the left ventricle. She is not currently having chest pain and troponin were low on admission and trended down. -daily weights -strict I and Os -holding furosemide 40 mg BID and spironolactone 50 mg qd   Normocytic anemia Iron studies, folate, and B12 within normal limits. I think that anemia could be related to chronic kidney disease.   Thrombocytopenia, mild  Platelets stable -trend CBC   Type 2 diabetes mellitus Hgba1c at 7.4. Diet controlled. -SSI, very sensitive -CBG with meals   Hypoalbuminemia Albumin of 2.9.   Hypertension Home medication include spironolactone 50 mg qd. Blood pressure is normotensive. Will hold BP medications for now.   Anxiety/ depression Home medication includes sertraline 25 mg qd. -Holding in setting of AKI and confusion   Chronic venous insufficiency Physical exam showed chronic venous stasis changes. Compression stocking appears to have caused a pressure injury to area below knees bilaterally. -monitor for signs of infection at pressure injury  Diet:  Carb-Modified IVF: LR,Bolus VTE:  Eliquis  Code: Full PT/OT recs: Pending. TOC recs: apixaban coverage Family Update: Laura George updated   Dispo: Anticipated discharge to Nursing Home in 1-2 days.   Laura George, D.O.  Internal Medicine Resident, PGY-1 [facility]  Pager: [phone] 6:12 AM, 07/16/2021   **Please contact the on call pager after 5 pm and on weekends at [phone].**

## 2021-07-16 NOTE — Progress Notes (Signed)
  Echocardiogram 2D Echocardiogram has been performed.  Laura George 07/16/2021, 10:38 AM

## 2021-07-17 DIAGNOSIS — G9341 Metabolic encephalopathy: Secondary | ICD-10-CM

## 2021-07-17 DIAGNOSIS — N179 Acute kidney failure, unspecified: Secondary | ICD-10-CM | POA: Diagnosis not present

## 2021-07-17 LAB — BASIC METABOLIC PANEL
Anion gap: 14 (ref 5–15)
BUN: 78 mg/dL — ABNORMAL HIGH (ref 8–23)
CO2: 22 mmol/L (ref 22–32)
Calcium: 10.1 mg/dL (ref 8.9–10.3)
Chloride: 102 mmol/L (ref 98–111)
Creatinine, Ser: 3.75 mg/dL — ABNORMAL HIGH (ref 0.44–1.00)
GFR, Estimated: 11 mL/min — ABNORMAL LOW (ref >60)
Glucose, Bld: 152 mg/dL — ABNORMAL HIGH (ref 70–99)
Potassium: 4.3 mmol/L (ref 3.5–5.1)
Sodium: 138 mmol/L (ref 135–145)

## 2021-07-17 LAB — GLUCOSE, CAPILLARY
Glucose-Capillary: 149 mg/dL — ABNORMAL HIGH (ref 70–99)
Glucose-Capillary: 157 mg/dL — ABNORMAL HIGH (ref 70–99)
Glucose-Capillary: 161 mg/dL — ABNORMAL HIGH (ref 70–99)
Glucose-Capillary: 161 mg/dL — ABNORMAL HIGH (ref 70–99)
Glucose-Capillary: 166 mg/dL — ABNORMAL HIGH (ref 70–99)
Glucose-Capillary: 253 mg/dL — ABNORMAL HIGH (ref 70–99)

## 2021-07-17 LAB — CBC
HCT: 35.8 % — ABNORMAL LOW (ref 36.0–46.0)
Hemoglobin: 11.4 g/dL — ABNORMAL LOW (ref 12.0–15.0)
MCH: 28 pg (ref 26.0–34.0)
MCHC: 31.8 g/dL (ref 30.0–36.0)
MCV: 88 fL (ref 80.0–100.0)
Platelets: 150 10*3/uL (ref 150–400)
RBC: 4.07 MIL/uL (ref 3.87–5.11)
RDW: 14 % (ref 11.5–15.5)
WBC: 7.5 10*3/uL (ref 4.0–10.5)
nRBC: 0 % (ref 0.0–0.2)

## 2021-07-17 MED ORDER — LACTATED RINGERS IV BOLUS
1000.0000 mL | Freq: Once | INTRAVENOUS | Status: AC
  Administered 2021-07-17: 1000 mL via INTRAVENOUS

## 2021-07-17 NOTE — Progress Notes (Signed)
HD#2 Subjective:  Overnight Events: none  Patient assessed at bedside this AM. She feels about the same as yesterday. She was able to work with PT and transfer to chair. She is unsure of what why she is in hospital.   Pt is updated on the plan for today, and all questions and concerns are addressed.   Objective:  Vital signs in last 24 hours: Vitals:   07/16/21 0915 07/16/21 1651 07/16/21 2118 07/17/21 0602  BP: 114/65 121/66 136/63 (!) 154/62  Pulse: 80 81 81 89  Resp: 18 18 20 16   Temp: 97.8 F (36.6 C) 98 F (36.7 C) 97.6 F (36.4 C) 98.1 F (36.7 C)  TempSrc: Oral   Oral  SpO2: 98% 99%  99%  Weight:      Height:       Supplemental O2: Room Air SpO2: 99 %   Physical Exam:  Constitutional: elderly, in no acute distress, sitting in chair Cardiovascular: regular rate and rhythm, no m/r/g Pulmonary/Chest: normal work of breathing on room air, lungs clear to auscultation bilaterally Abdominal: soft, non-tender, non-distended MSK: pressure ulcer from compression stocking just below knee bilaterally without surrounding erythema, senile purpura present to lower extremities bilaterally Neurological: alert, oriented to self and place only Skin: warm and dry Psych: normal mood and affect  [facility] Weights   07/15/21 2253 07/16/21 0500  Weight: 63.7 kg 63.7 kg     Intake/Output Summary (Last 24 hours) at 07/17/2021 07/19/2021 Last data filed at 07/17/2021 0553 Gross per 24 hour  Intake 972.16 ml  Output 1050 ml  Net -77.84 ml    Net IO Since Admission: -807.84 mL [07/17/21 0632]  Pertinent Labs:    Latest Ref Rng & Units 07/16/2021    3:33 AM 07/15/2021    2:40 PM  CBC  WBC 4.0 - 10.5 K/uL 7.0  8.4   Hemoglobin 12.0 - 15.0 g/dL 07/17/2021  01.7   Hematocrit 36.0 - 46.0 % 35.8  34.5   Platelets 150 - 400 K/uL 147  145        Latest Ref Rng & Units 07/16/2021    3:33 AM 07/15/2021    2:40 PM  CMP  Glucose 70 - 99 mg/dL 07/17/2021  258   BUN 8 - 23 mg/dL 78  80   Creatinine  527 - 1.00 mg/dL 7.82  4.23   Sodium 5.36 - 145 mmol/L 138  137   Potassium 3.5 - 5.1 mmol/L 4.7  4.4   Chloride 98 - 111 mmol/L 102  101   CO2 22 - 32 mmol/L 22  23   Calcium 8.9 - 10.3 mg/dL 144  31.5   Total Protein 6.5 - 8.1 g/dL  6.1   Total Bilirubin 0.3 - 1.2 mg/dL  1.2   Alkaline Phos 38 - 126 U/L  67   AST 15 - 41 U/L  15   ALT 0 - 44 U/L  11     Imaging: ECHOCARDIOGRAM COMPLETE  Result Date: 07/16/2021    ECHOCARDIOGRAM REPORT   Patient Name:   Laura George Date of Exam: 07/16/2021 Medical Rec #:  07/18/2021     Height:       66.0 in Accession #:    [id number]    Weight:       140.4 lb Date of Birth:  1925-03-07     BSA:          1.721 m Patient Age:    39 years  BP:           114/65 mmHg Patient Gender: F             HR:           75 bpm. Exam Location:  Inpatient Procedure: 2D Echo, Cardiac Doppler, Color Doppler and Intracardiac            Opacification Agent Indications:    Atrial fibrillation  History:        Patient has no prior history of Echocardiogram examinations.                 Risk Factors:Hypertension and Diabetes.  Sonographer:    Ross Ludwig RDCS (AE) Referring Phys: [id number] Reymundo Poll  Sonographer Comments: Image acquisition challenging due to respiratory motion. IMPRESSIONS  1. Left ventricular ejection fraction, by estimation, is 50 to 55%. The left ventricle has low normal function. The left ventricle demonstrates regional wall motion abnormalities (abnormal septal motion). There is mild concentric left ventricular hypertrophy. Left ventricular diastolic function could not be evaluated.  2. Right ventricular systolic function is normal. The right ventricular size is normal. There is normal pulmonary artery systolic pressure.  3. The mitral valve is grossly normal. No evidence of mitral valve regurgitation. The mean mitral valve gradient is 3.0 mmHg with average heart rate of 91 bpm.  4. The aortic valve was not well visualized. Aortic valve regurgitation is not  visualized. No aortic stenosis is present.  5. The inferior vena cava is normal in size with greater than 50% respiratory variability, suggesting right atrial pressure of 3 mmHg. Comparison(s): No prior Echocardiogram. FINDINGS  Left Ventricle: Left ventricular ejection fraction, by estimation, is 50 to 55%. The left ventricle has low normal function. The left ventricle demonstrates regional wall motion abnormalities. Definity contrast agent was given IV to delineate the left ventricular endocardial borders. The left ventricular internal cavity size was small. There is mild concentric left ventricular hypertrophy. Left ventricular diastolic function could not be evaluated due to atrial fibrillation. Left ventricular diastolic  function could not be evaluated. Right Ventricle: The right ventricular size is normal. Right vetricular wall thickness was not well visualized. Right ventricular systolic function is normal. There is normal pulmonary artery systolic pressure. The tricuspid regurgitant velocity is 2.39 m/s, and with an assumed right atrial pressure of 3 mmHg, the estimated right ventricular systolic pressure is 25.8 mmHg. Left Atrium: Left atrial size was normal in size. Right Atrium: Right atrial size was normal in size. Pericardium: Trivial pericardial effusion is present. Presence of epicardial fat layer. Mitral Valve: The mitral valve is grossly normal. No evidence of mitral valve regurgitation. The mean mitral valve gradient is 3.0 mmHg with average heart rate of 91 bpm. Tricuspid Valve: The tricuspid valve is not well visualized. Tricuspid valve regurgitation is trivial. Aortic Valve: The aortic valve was not well visualized. Aortic valve regurgitation is not visualized. No aortic stenosis is present. Aortic valve mean gradient measures 2.0 mmHg. Aortic valve peak gradient measures 3.6 mmHg. Aortic valve area, by VTI measures 2.32 cm. Pulmonic Valve: The pulmonic valve was not well visualized. Pulmonic  valve regurgitation is not visualized. Aorta: The aortic root and ascending aorta are structurally normal, with no evidence of dilitation. Venous: The inferior vena cava is normal in size with greater than 50% respiratory variability, suggesting right atrial pressure of 3 mmHg. IAS/Shunts: The atrial septum is grossly normal.  LEFT VENTRICLE PLAX 2D LVIDd:         3.50  cm LVIDs:         2.40 cm LV PW:         1.20 cm LV IVS:        1.30 cm LVOT diam:     1.90 cm LV SV:         45 LV SV Index:   26 LVOT Area:     2.84 cm  LV Volumes (MOD) LV vol d, MOD A2C: 63.1 ml LV vol d, MOD A4C: 64.3 ml LV vol s, MOD A2C: 24.3 ml LV vol s, MOD A4C: 38.7 ml LV SV MOD A2C:     38.8 ml LV SV MOD A4C:     64.3 ml LV SV MOD BP:      33.2 ml RIGHT VENTRICLE            IVC RV Basal diam:  2.70 cm    IVC diam: 1.80 cm RV S prime:     8.27 cm/s TAPSE (M-mode): 1.3 cm LEFT ATRIUM             Index        RIGHT ATRIUM           Index LA diam:        2.80 cm 1.63 cm/m   RA Area:     12.40 cm LA Vol (A2C):   15.0 ml 8.72 ml/m   RA Volume:   26.00 ml  15.11 ml/m LA Vol (A4C):   37.4 ml 21.74 ml/m LA Biplane Vol: 23.4 ml 13.60 ml/m  AORTIC VALVE AV Area (Vmax):    2.57 cm AV Area (Vmean):   2.71 cm AV Area (VTI):     2.32 cm AV Vmax:           94.50 cm/s AV Vmean:          65.500 cm/s AV VTI:            0.193 m AV Peak Grad:      3.6 mmHg AV Mean Grad:      2.0 mmHg LVOT Vmax:         85.70 cm/s LVOT Vmean:        62.600 cm/s LVOT VTI:          0.158 m LVOT/AV VTI ratio: 0.82  AORTA Ao Root diam: 2.90 cm Ao Asc diam:  2.60 cm MITRAL VALVE           TRICUSPID VALVE MV Mean grad: 3.0 mmHg TR Peak grad:   22.8 mmHg                        TR Vmax:        239.00 cm/s                         SHUNTS                        Systemic VTI:  0.16 m                        Systemic Diam: 1.90 cm Rudean Haskell MD Electronically signed by Rudean Haskell MD Signature Date/Time: 07/16/2021/12:10:31 PM    Final     Assessment/Plan:    Principal Problem:   AKI (acute kidney injury) ([place]) Active Problems:   Acute metabolic encephalopathy   Patient Summary: Laura George is a 88 y.o. with a  pertinent PMH of heart failure, hypertension, type 2 diabetes mellitus, who presented with increased confusion and weakness and admitted on 06/19 for acute metabolic encephalopathy on HD#2. Mentation is improving with current therapies. PT/OT recommended SNF.  Acute metabolic encephalopathy Uremia Urinary retention Possible UTI Mentation is improved on exam today. She is more alert and is now oriented to self and place including hospital name. She is more interactive today. Will plan to continue IV antibiotic therapy for now. If she continues to improve will likely transition to oral antibiotic tomorrow to finish 5 day course. -urine culture pending -Ceftriaxone 1 g, Day 2 -hold sertraline -delirium precautions -foley catheter 6/20 -PT/OT eval   AKI on CKD, unknown baseline Creatinine improving from 3.93 to 3.75 with fluids and foley catheter placement. Urine output at 1L. Will continue to hold furosemide for now as patient appears euvolemic. Will plan on removing foley 6/22 for void trial. -urine Na, creatinine pending -avoid nephrotoxins - Foley catheter in place   Atrial fibrillation with controlled rates Elevated troponin EKG showed atrial fibrillation with left axis deviation and LBBB. CHADVASC score at 6, anticoagulation has been started. -apixaban 2.5 mg BID -telemetry   Heart failure with unknown EF Echo showed EF of 50-55% with regional wall motion abnormalities of the left ventricle.  -daily weights -strict I and Os -holding furosemide 40 mg BID and spironolactone 50 mg qd   Normocytic anemia Iron studies, folate, and B12 within normal limits. I think that anemia could be related to chronic kidney disease.   Thrombocytopenia, mild  Platelets stable -trend CBC   Type 2 diabetes mellitus Hgba1c at 7.4.  Diet controlled. -SSI, very sensitive -CBG with meals   Hypoalbuminemia Albumin of 2.9.   Hypertension Home medication include spironolactone 50 mg qd. Blood pressure is normotensive. Will hold BP medications for now.   Anxiety/ depression Home medication includes sertraline 25 mg qd. -Holding in setting of AKI and confusion   Chronic venous insufficiency Physical exam showed chronic venous stasis changes. Compression stocking appears to have caused a pressure injury to area below knees bilaterally. -monitor for signs of infection at pressure injury  Diet: Carb-Modified IVF: none VTE:  Eliquis  Code: Full PT/OT recs: Pending. TOC recs: apixaban coverage Family Update: Updated son, Laura George   Dispo: Anticipated discharge to Rehab in 1-2 days following improvement in AKI.  Laura George M. Sandeep Delagarza, D.O.  Internal Medicine Resident, PGY-1 [facility]  Pager: [phone] 6:32 AM, 07/17/2021   **Please contact the on call pager after 5 pm and on weekends at [phone].**

## 2021-07-17 NOTE — TOC Initial Note (Signed)
Transition of Care [facility]) - Initial/Assessment Note    Patient Details  Name: Laura George MRN: [id number] Date of Birth: [birth year]  Transition of Care [facility]) CM/SW Contact:    Ralene Bathe, LCSWA Phone Number: 07/17/2021, 4:39 PM  Clinical Narrative:                 CSW received consult for possible SNF placement at time of discharge. CSW spoke with patient's son Felicity Pellegrini as the patient is only oriented to person.  Felicity Pellegrini would like for the patient to return to [facility].    CSW attempted to contact [name] at [facility] [phone]) but they were not available.  CSW was encouraged to call the facility back tomorrow.  No further questions reported at this time.   Skilled Nursing Rehab Facilities-   [email or web address]   Ratings out of 5 possible   Name Address  Phone # Quality Care Staffing Health Inspection Overall  [facility] [address], [place] [phone] 4 5 2 3   [facility]  [address], [place] [phone] 3 2 5 5   [facility] [address] [place], [address] [place] 3 1 1 1   [facility] [address] 3 2 4 4   [facility] [address] [phone] 1 1 2 1   [facility] [phone] [address] [phone] 2 1 4 3   [facility] [address], [address] [place] 5 2 3 4   [facility] [address], [facility] [place] 5 2 2 3   [address] ([place]) [address] [facility] 5 1 2 2   [facility] [address], [place] [phone] 4 1 2 1   [facility] [address], [facility] [phone] 4 1 2 1   [facility] [address] [facility], [place] [phone] 4 1 1 1   [address] [facility] 3 2 4 4           [facility] [address], [facility] [place]      [facility] [address], [phone]  4 2 3 3   [facility] [place] [address] [phone] 4 1 5 4   [address] [place] [address] [place] [facility] [address], [place] [phone] 2 1 1 1   [facility] [address] [place] [phone] 2 1 3 2           [address] (no [facility]) [address], [place] [phone] 4 5 5 5   [facility] (No [employer]) [address], [place] 3 1 4 3   [facility] (No [employer]) [address], [place] [phone] 5 5 5 5   [facility] [address], [employer] [phone] 3 2 4 4   [facility] [address] [place] [address] [facility] 1 1 2 1   [facility] [address], [phone] 2 1 1 1   [place] [address] [place], [phone] 5 2 4 5   [facility] [address], [place] 3 1 1 1   [facility] [address] [place], [place] [employer] 2 1 2 1           [facility] [address] [place] [phone] 1 1 1 1   [place] [address]  [phone] 2 4 2 2   [facility] [place] [address], [phone] 5 2 3 4   [facility] [place] [address], [place] [phone] 2 1 1 1   [facility] (  No Hum[employer]) [address] [place] [phone] 2 1 3 2   [facility] [address], [address] [phone] 3 1 1 1           [facility] [address] [place], [address] [place] 5 4 5 5   [facility])  [address] [facility] 2 2 3 3   [facility]) [address], [phone] 3 2 4 4   [facility] [place] [address] [phone] 4 3 4 4   [address] [place], [place] [phone] 3 3 1 1   [facility]) [address] [place] [phone] 2 2 4 4      Expected Discharge Plan: Skilled Nursing Facility Barriers to Discharge: Continued Medical Work up   Patient Goals and CMS Choice   CMS  Medicare.gov Compare Post Acute Care list provided to:: Patient Represenative (must comment) Choice offered to / list presented to : Adult Children  Expected Discharge Plan and Services Expected Discharge Plan: Skilled Nursing Facility       Living arrangements for the past 2 months: Assisted Living Facility                                      Prior Living Arrangements/Services Living arrangements for the past 2 months: Assisted Living Facility Lives with:: Facility Resident Patient language and need for interpreter reviewed:: Yes Do you feel safe going back to the place where you live?: Yes      Need for Family Participation in Patient Care: Yes (Comment) Care giver support system in place?: Yes (comment)   Criminal Activity/Legal Involvement Pertinent to Current Situation/Hospitalization: No - Comment as needed  Activities of Daily Living      Permission Sought/Granted   Permission granted to share information with : Yes, Verbal Permission Granted              Emotional Assessment Appearance:: Appears stated age     Orientation: : Oriented to Self Alcohol / Substance Use: Not Applicable Psych Involvement: No (comment)  Admission diagnosis:  General weakness [R53.1] Persistent atrial fibrillation (HCC) [I48.19] Troponin level elevated [R77.8] AKI (acute kidney injury) (HCC) [N17.9] Acute renal failure, unspecified acute renal failure type (HCC) [N17.9] Patient Active Problem List   Diagnosis Date Noted   Acute metabolic encephalopathy 07/16/2021   AKI (acute kidney injury) (HCC) 07/15/2021   PCP:  [phone], FNP Pharmacy:  No Pharmacies Listed    Social Determinants of Health (SDOH) Interventions    Readmission Risk Interventions     No data to display

## 2021-07-17 NOTE — Evaluation (Signed)
Physical Therapy Evaluation Patient Details Name: Laura George MRN: [id number] DOB: September 03, 1925 Today's Date: 07/17/2021  History of Present Illness  88 y.o. female [facility] to [facility]15[facility]21 from [place] with increase confusion and weakness. Found to have acute metabolic encephalopathy in setting of uremia and AKI on CKD. PMH of Heart failure, hypertension, type 2 diabetes mellitus  Clinical Impression  PTA pt living at [facility], pt with dementia and currently oriented to self, does not provide accurate PLOF. Pt limited in safe mobility by poor safety awareness, and decreased cognition in presence of generalized weakness and decreased balance. Pt noted to have sacral wound and pressure injuries from compression hose. Pt is min guard for bed mobility and modAx2 for transfers. PT recommending SNF level rehab at discharge to improve strength and balance for transfers. PT will continue to follow acutely.     Recommendations for follow up therapy are one component of a multi-disciplinary discharge planning process, led by the attending physician.  Recommendations may be updated based on patient status, additional functional criteria and insurance authorization.  Follow Up Recommendations Skilled nursing-short term rehab (<3 hours/day) Can patient physically be transported by private vehicle: Yes    Assistance Recommended at Discharge Frequent or constant Supervision/Assistance  Patient can return home with the following  Assistance with cooking/housework;Assistance with feeding;Direct supervision/assist for medications management;Direct supervision/assist for financial management;Assist for transportation;Help with stairs or ramp for entrance;Two people to help with bathing/dressing/bathroom;A lot of help with walking and/or transfers    Equipment Recommendations Other (comment) (unsure)     Functional Status Assessment Patient has had a recent decline in  their functional status and demonstrates the ability to make significant improvements in function in a reasonable and predictable amount of time.     Precautions / Restrictions Precautions Precautions: Fall Restrictions Weight Bearing Restrictions: No      Mobility  Bed Mobility Overal bed mobility: Needs Assistance Bed Mobility: Supine to Sit     Supine to sit: Min guard, HOB elevated     General bed mobility comments: min guard and increased time and effort and use of bed rail to pull to EoB    Transfers Overall transfer level: Needs assistance Equipment used: Rolling walker (2 wheels) Transfers: Sit to/from Stand, Bed to chair/wheelchair/BSC Sit to Stand: Mod assist, +2 safety/equipment   Step pivot transfers: Mod assist, +2 safety/equipment       General transfer comment: mod physical assist and maximal multimodal cuing for hand placement for power up and stepping pivot transfer to recliner    Ambulation/Gait               General Gait Details: deferred do to generalized weakness      Balance Overall balance assessment: Needs assistance Sitting-balance support: No upper extremity supported Sitting balance-Leahy Scale: Fair     Standing balance support: During functional activity, Reliant on assistive device for balance Standing balance-Leahy Scale: Poor                               Pertinent Vitals/Pain Pain Assessment Pain Assessment: Faces Faces Pain Scale: Hurts little more Pain Location: sacrum Pain Descriptors / Indicators: Grimacing, Guarding, Sore Pain Intervention(s): Limited activity within patient's tolerance, Monitored during session, Repositioned    Home Living Family/patient expects to be discharged to:: Assisted living [facility])  Prior Function Prior Level of Function : Needs assist  Cognitive Assist : Mobility (cognitive);ADLs (cognitive) Mobility (Cognitive): Step by step  cues ADLs (Cognitive): Step by step cues Physical Assist : Mobility (physical);ADLs (physical) Mobility (physical): Transfers ADLs (physical): Bathing;Dressing;Toileting Mobility Comments: reports she spends some time in wheelchair, unclear what DME she uses for mobility ADLs Comments: Pt reports "yes" when asked if she had to have assist with bathing and dressing tasks.     Hand Dominance   Dominant Hand: Right (per patient report)    Extremity/Trunk Assessment   Upper Extremity Assessment Upper Extremity Assessment: Defer to OT evaluation    Lower Extremity Assessment Lower Extremity Assessment: RLE deficits/detail;LLE deficits/detail RLE Deficits / Details: possible DTI rings around calves where compression socks left on for long period of time LLE Deficits / Details: possible DTI rings around calves where compression socks left on for long period of time    Cervical / Trunk Assessment Cervical / Trunk Assessment: Kyphotic  Communication   Communication: No difficulties  Cognition Arousal/Alertness: Awake/alert Behavior During Therapy: Flat affect Overall Cognitive Status: No family/caregiver present to determine baseline cognitive functioning                                 General Comments: Pt with history of dementia per chart, unsure if this current admission has exacerbated these symptoms.  Pt not oriented to place, month, or situation.  Unsure where she lives even when asked if she lives at [facility], Reports her son's name is "Ted"        General Comments General comments (skin integrity, edema, etc.): VSS on RA        Assessment/Plan    PT Assessment Patient needs continued PT services  PT Problem List Decreased strength;Decreased activity tolerance;Decreased balance;Decreased mobility;Decreased cognition;Decreased safety awareness;Decreased skin integrity       PT Treatment Interventions DME instruction;Gait training;Functional  mobility training;Therapeutic activities;Therapeutic exercise;Balance training;Cognitive remediation;Patient/family education    PT Goals (Current goals can be found in the Care Plan section)  Acute Rehab PT Goals Patient Stated Goal: none stated PT Goal Formulation: Patient unable to participate in goal setting Time For Goal Achievement: 07/31/21 Potential to Achieve Goals: Fair    Frequency Min 3X/week        AM-PAC PT "6 Clicks" Mobility  Outcome Measure Help needed turning from your back to your side while in a flat bed without using bedrails?: None Help needed moving from lying on your back to sitting on the side of a flat bed without using bedrails?: A Little Help needed moving to and from a bed to a chair (including a wheelchair)?: A Lot Help needed standing up from a chair using your arms (e.g., wheelchair or bedside chair)?: A Lot Help needed to walk in hospital room?: Total Help needed climbing 3-5 steps with a railing? : Total 6 Click Score: 13    End of Session Equipment Utilized During Treatment: Gait belt Activity Tolerance: Patient tolerated treatment well Patient left: in bed;with call bell/phone within reach;with bed alarm set Nurse Communication: Mobility status;Other (comment) (request air mattress due to stage 2 sacral wound) PT Visit Diagnosis: Unsteadiness on feet (R26.81);Other abnormalities of gait and mobility (R26.89);Muscle weakness (generalized) (M62.81);Difficulty in walking, not elsewhere classified (R26.2)    Time: 3500-9381 PT Time Calculation (min) (ACUTE ONLY): 29 min   Charges:   PT Evaluation $PT Eval Moderate Complexity: 1 Mod PT Treatments $Therapeutic  Activity: 8-22 mins        Niani Mourer B. Beverely Risen PT, DPT Acute Rehabilitation Services Please use secure chat or  Call Office [phone]   Elon Alas Fleet 07/17/2021, 10:30 AM

## 2021-07-17 NOTE — Evaluation (Signed)
Occupational Therapy Evaluation Patient Details Name: Laura George MRN: [id number] DOB: 05-04-1925 Today's Date: 07/17/2021   History of Present Illness 88 y.o. female presents to [facility] 07/15/21 from [facility] with increase confusion and weakness. Found to have acute metabolic encephalopathy in setting of uremia and AKI on CKD. PMH of Heart failure, hypertension, type 2 diabetes mellitus   Clinical Impression   Pt currently presents at a min to mod assist level for simulated selfcare tasks and functional transfers to the 3:1.  Feel she will benefit from acute care OT to help address deficits in order to return to ALF vs SNF where she will need 24 hr assist for safety and continued OT treatment.     Recommendations for follow up therapy are one component of a multi-disciplinary discharge planning process, led by the attending physician.  Recommendations may be updated based on patient status, additional functional criteria and insurance authorization.   Follow Up Recommendations  Skilled nursing-short term rehab (<3 hours/day)    Assistance Recommended at Discharge Frequent or constant Supervision/Assistance  Patient can return home with the following A little help with walking and/or transfers;A little help with bathing/dressing/bathroom;Assistance with cooking/housework;Direct supervision/assist for financial management;Help with stairs or ramp for entrance;Direct supervision/assist for medications management;Assist for transportation    Functional Status Assessment  Patient has had a recent decline in their functional status and demonstrates the ability to make significant improvements in function in a reasonable and predictable amount of time.  Equipment Recommendations  Other (comment) (defer to next venue of care)       Precautions / Restrictions Precautions Precautions: Fall Restrictions Weight Bearing Restrictions: No      Mobility Bed Mobility                     Transfers Overall transfer level: Needs assistance Equipment used: Rolling walker (2 wheels) Transfers: Sit to/from Stand, Bed to chair/wheelchair/BSC Sit to Stand: Mod assist     Step pivot transfers: Modified independent (Device/Increase time)     General transfer comment: Mod assist for stand pivot transfer with use of the RW.  Max demonstrational cueing for advancing the RW as well.      Balance Overall balance assessment: Needs assistance Sitting-balance support: No upper extremity supported Sitting balance-Leahy Scale: Fair     Standing balance support: During functional activity, Reliant on assistive device for balance Standing balance-Leahy Scale: Poor                             ADL either performed or assessed with clinical judgement   ADL Overall ADL's : Needs assistance/impaired Eating/Feeding: Set up;Sitting Eating/Feeding Details (indicate cue type and reason): to drink from a cup Grooming: Wash/dry face;Set up;Sitting               Lower Body Dressing: Total assistance Lower Body Dressing Details (indicate cue type and reason): sitting in recliner for gripper socks only Toilet Transfer: Moderate assistance;BSC/3in1;Stand-pivot;Rolling walker (2 wheels) Toilet Transfer Details (indicate cue type and reason): simulated, pt declined needing to use the toilet Toileting- Clothing Manipulation and Hygiene: Moderate assistance;Sit to/from stand       Functional mobility during ADLs: Moderate assistance;Cueing for sequencing;Rolling walker (2 wheels) General ADL Comments: Pt with flat affect but able to follow commands appropriately.  Mod assist for functional transfers and mobility with use of the RW for support in order to ambuate to the sink and back to  the bedside recliner.  Max demonstrational cueing for advancing and positioning the RW during all transfers.     Vision Baseline Vision/History: 1 Wears glasses Ability to See  in Adequate Light: 0 Adequate Patient Visual Report: No change from baseline Vision Assessment?: No apparent visual deficits     Perception Perception Perception: Within Functional Limits   Praxis Praxis Praxis: Intact    Pertinent Vitals/Pain Pain Assessment Pain Assessment: Faces Pain Score: 0-No pain     Hand Dominance Right (per patient report)   Extremity/Trunk Assessment Upper Extremity Assessment Upper Extremity Assessment: Generalized weakness   Lower Extremity Assessment Lower Extremity Assessment: Defer to PT evaluation   Cervical / Trunk Assessment Cervical / Trunk Assessment: Kyphotic   Communication Communication Communication: No difficulties   Cognition Arousal/Alertness: Awake/alert Behavior During Therapy: Flat affect Overall Cognitive Status: No family/caregiver present to determine baseline cognitive functioning                                 General Comments: Pt with history of dementia per chart, unsure if this current admission has exacerbated these symptoms.  Pt not oriented to place, month, or situation.  When asked what city she was in she stated "[address] or [place]".  She was able to state that she had a son named Emergency planning/management officer.                Home Living Family/patient expects to be discharged to:: Assisted living [facility])                                        Prior Functioning/Environment Prior Level of Function : Needs assist  Cognitive Assist : Mobility (cognitive);ADLs (cognitive) Mobility (Cognitive): Step by step cues ADLs (Cognitive): Step by step cues Physical Assist : Mobility (physical);ADLs (physical) Mobility (physical): Transfers ADLs (physical): Bathing;Dressing;Toileting   ADLs Comments: Pt reports "yes" when asked if she had to have assist with bathing and dressing tasks.        OT Problem List: Decreased strength;Decreased activity tolerance;Impaired balance (sitting and/or  standing);Decreased knowledge of use of DME or AE;Decreased cognition;Decreased safety awareness      OT Treatment/Interventions: Self-care/ADL training;Therapeutic exercise;Therapeutic activities;DME and/or AE instruction;Balance training;Patient/family education    OT Goals(Current goals can be found in the care plan section) Acute Rehab OT Goals Patient Stated Goal: Pt did not state but agreeable to therapy OT Goal Formulation: With patient Time For Goal Achievement: 07/31/21 Potential to Achieve Goals: Good  OT Frequency: Min 2X/week       AM-PAC OT "6 Clicks" Daily Activity     Outcome Measure Help from another person eating meals?: A Little Help from another person taking care of personal grooming?: A Little Help from another person toileting, which includes using toliet, bedpan, or urinal?: A Lot Help from another person bathing (including washing, rinsing, drying)?: A Lot Help from another person to put on and taking off regular upper body clothing?: A Little Help from another person to put on and taking off regular lower body clothing?: A Lot 6 Click Score: 15   End of Session Equipment Utilized During Treatment: Gait belt;Rolling walker (2 wheels) Nurse Communication: Mobility status  Activity Tolerance: Patient tolerated treatment well Patient left: in chair;with call bell/phone within reach;with chair alarm set  OT Visit Diagnosis: Unsteadiness on feet (R26.81);Muscle weakness (  generalized) (M62.81)                Time: 3976-7341 OT Time Calculation (min): 23 min Charges:  OT General Charges $OT Visit: 1 Visit OT Evaluation $OT Eval Moderate Complexity: 1 Mod OT Treatments $Self Care/Home Management : 8-22 mins Eternity Dexter OTR/L 07/17/2021, 9:56 AM

## 2021-07-18 DIAGNOSIS — N189 Chronic kidney disease, unspecified: Secondary | ICD-10-CM

## 2021-07-18 LAB — BASIC METABOLIC PANEL
Anion gap: 10 (ref 5–15)
BUN: 69 mg/dL — ABNORMAL HIGH (ref 8–23)
CO2: 25 mmol/L (ref 22–32)
Calcium: 9.8 mg/dL (ref 8.9–10.3)
Chloride: 102 mmol/L (ref 98–111)
Creatinine, Ser: 3.48 mg/dL — ABNORMAL HIGH (ref 0.44–1.00)
GFR, Estimated: 12 mL/min — ABNORMAL LOW (ref >60)
Glucose, Bld: 197 mg/dL — ABNORMAL HIGH (ref 70–99)
Potassium: 3.9 mmol/L (ref 3.5–5.1)
Sodium: 137 mmol/L (ref 135–145)

## 2021-07-18 LAB — CBC
HCT: 32 % — ABNORMAL LOW (ref 36.0–46.0)
Hemoglobin: 10.6 g/dL — ABNORMAL LOW (ref 12.0–15.0)
MCH: 28.8 pg (ref 26.0–34.0)
MCHC: 33.1 g/dL (ref 30.0–36.0)
MCV: 87 fL (ref 80.0–100.0)
Platelets: 130 10*3/uL — ABNORMAL LOW (ref 150–400)
RBC: 3.68 MIL/uL — ABNORMAL LOW (ref 3.87–5.11)
RDW: 14 % (ref 11.5–15.5)
WBC: 5.8 10*3/uL (ref 4.0–10.5)
nRBC: 0 % (ref 0.0–0.2)

## 2021-07-18 LAB — GLUCOSE, CAPILLARY
Glucose-Capillary: 163 mg/dL — ABNORMAL HIGH (ref 70–99)
Glucose-Capillary: 190 mg/dL — ABNORMAL HIGH (ref 70–99)
Glucose-Capillary: 168 mg/dL — ABNORMAL HIGH (ref 70–99)
Glucose-Capillary: 218 mg/dL — ABNORMAL HIGH (ref 70–99)
Glucose-Capillary: 167 mg/dL — ABNORMAL HIGH (ref 70–99)

## 2021-07-18 NOTE — Progress Notes (Addendum)
HD#3 Subjective:  Overnight Events: none  Patient assessed at bedside this AM. She does not have complaints at this time.  Pt is updated on the plan for today, and all questions and concerns are addressed.   Objective:  Vital signs in last 24 hours: Vitals:   07/17/21 1634 07/17/21 2129 07/18/21 0615 07/18/21 0944  BP: 98/64 124/60 111/82 (!) 116/59  Pulse: 84 63 76 (!) 103  Resp: 18 18 19 18   Temp: 98.4 F (36.9 C) 98 F (36.7 C) 97.6 F (36.4 C) 97.8 F (36.6 C)  TempSrc: Oral  Oral Oral  SpO2: 100% 94%  100%  Weight:   63.5 kg   Height:       Supplemental O2: Room Air SpO2: 100 %   Physical Exam:  Constitutional: elderly, in no acute distress, laying in bed Cardiovascular: irregularly irregular no m/r/g Pulmonary/Chest: normal work of breathing on room air, lungs clear to auscultation bilaterally Abdominal: soft, non-tender, non-distended, bowel sounds present MSK: trace edema in lower extremities bilaterally, senile purpura present on lower extremities  Neuro: alert and oriented to self and place, not oriented to time at baseline Skin: warm and dry Psych: normal mood and affect  Filed Weights   07/15/21 2253 07/16/21 0500 07/18/21 0615  Weight: 63.7 kg 63.7 kg 63.5 kg     Intake/Output Summary (Last 24 hours) at 07/18/2021 1157 Last data filed at 07/18/2021 0930 Gross per 24 hour  Intake 1548.63 ml  Output 850 ml  Net 698.63 ml    Net IO Since Admission: 250.79 mL [07/18/21 1157]  Pertinent Labs:    Latest Ref Rng & Units 07/18/2021    3:49 AM 07/17/2021    7:11 AM 07/16/2021    3:33 AM  CBC  WBC 4.0 - 10.5 K/uL 5.8  7.5  7.0   Hemoglobin 12.0 - 15.0 g/dL 07/18/2021  09.8  11.9   Hematocrit 36.0 - 46.0 % 32.0  35.8  35.8   Platelets 150 - 400 K/uL 130  150  147        Latest Ref Rng & Units 07/18/2021    3:49 AM 07/17/2021    7:11 AM 07/16/2021    3:33 AM  CMP  Glucose 70 - 99 mg/dL 07/18/2021  829  562   BUN 8 - 23 mg/dL 69  78  78   Creatinine 0.44 -  1.00 mg/dL 130  8.65  7.84   Sodium 135 - 145 mmol/L 137  138  138   Potassium 3.5 - 5.1 mmol/L 3.9  4.3  4.7   Chloride 98 - 111 mmol/L 102  102  102   CO2 22 - 32 mmol/L 25  22  22    Calcium 8.9 - 10.3 mg/dL 9.8  6.96      Imaging: No results found.  Assessment/Plan:   Principal Problem:   AKI (acute kidney injury) (HCC) Active Problems:   Acute metabolic encephalopathy   Patient Summary: Laura George is a 88 y.o. with a pertinent PMH of heart failure, hypertension, type 2 diabetes mellitus, who presented with increased confusion and weakness and admitted on 06/19 for acute metabolic encephalopathy on HD#3. Mentation is improving with current therapies. PT/OT recommended SNF.  Acute metabolic encephalopathy- resolved Uremia Likely UTI She continues to seem improved on exam today. At baseline she is oriented to self and place, but not time. Urine culture came back with >100,000 colonies gram negative rods, I will continue IV antibiotics and follow-up on  sensitivities to narrow antibiotic.  -urine culture grew gram negative rods >100,000 -Ceftriaxone 1 g, Day 3/5 -hold sertraline -delirium precautions -foley catheter 6/20, voiding trial today with q4 bladder scans -PT/OT recommending SNF  AKI on CKD, unknown baseline Creatinine improving from 3.75 to 3.48 with fluids and foley catheter placement. Urine output at 0.6L. Voiding trial today with q4 bladder scans. I think that furosemide and spironolactone could have contributed to worsened renal function due to hypovolemia. It sounds like she was having decreased PO intake and all medications were continued leading to hypovolemia. -avoid nephrotoxins -voiding trial   New Atrial fibrillation with controlled rates Elevated troponin Heart rates remain controlled -apixaban 2.5 mg BID -telemetry   Heart failure with unknown EF Echo showed EF of 50-55% with regional wall motion abnormalities of the left ventricle. Blood  pressure has been normotensive, will continue to hold furosemide and spironolactone. -daily weights -strict I and Os -holding furosemide 40 mg BID and spironolactone 50 mg qd   Type 2 diabetes mellitus Hgba1c at 7.4. Diet controlled. -SSI, very sensitive -CBG with meals   Diet: Carb-Modified IVF: none VTE:  Eliquis  Code: Full PT/OT recs: SNF TOC recs: apixaban Family Update: Updated son, Laura George 6/21.   Dispo: Anticipated discharge to Rehab in 1-2 days following improvement in AKI.  Laura George, D.O.  Internal Medicine Resident, PGY-1 [facility]  Pager: [phone] 11:57 AM, 07/18/2021   **Please contact the on call pager after 5 pm and on weekends at [phone].**

## 2021-07-18 NOTE — Plan of Care (Signed)
  Problem: Pain Managment: Goal: General experience of comfort will improve Outcome: Progressing   Problem: Safety: Goal: Ability to remain free from injury will improve Outcome: Progressing   Problem: Skin Integrity: Goal: Risk for impaired skin integrity will decrease Outcome: Progressing   

## 2021-07-18 NOTE — TOC Progression Note (Signed)
Transition of Care [facility]) - Initial/Assessment Note    Patient Details  Name: Laura George MRN: [id number] Date of Birth: February 25, 1925  Transition of [facility]) CM/SW Contact:    Laura George, LCSWA Phone Number: 07/18/2021, 11:01 AM  Clinical Narrative:                 CSW spoke with Laura George at [facility].  The patient can return  The facility will need an FL2 prior to the patient returning faxed to [phone].    TOC will continue to follow.  Expected Discharge Plan: Skilled Nursing Facility Barriers to Discharge: Continued Medical Work up   Patient Goals and CMS Choice   CMS Medicare.gov Compare Post Acute Care list provided to:: Patient Represenative (must comment) Choice offered to / list presented to : Adult Children  Expected Discharge Plan and Services Expected Discharge Plan: Skilled Nursing Facility       Living arrangements for the past 2 months: Assisted Living Facility                                      Prior Living Arrangements/Services Living arrangements for the past 2 months: Assisted Living Facility Lives with:: Facility Resident Patient language and need for interpreter reviewed:: Yes Do you feel safe going back to the place where you live?: Yes      Need for Family Participation in Patient Care: Yes (Comment) Care giver support system in place?: Yes (comment)   Criminal Activity/Legal Involvement Pertinent to Current Situation/Hospitalization: No - Comment as needed  Activities of Daily Living      Permission Sought/Granted   Permission granted to share information with : Yes, Verbal Permission Granted              Emotional Assessment Appearance:: Appears stated age     Orientation: : Oriented to Self Alcohol / Substance Use: Not Applicable Psych Involvement: No (comment)  Admission diagnosis:  General weakness [R53.1] Persistent atrial fibrillation (HCC) [I48.19] Troponin level elevated [R77.8] AKI (acute  kidney injury) (HCC) [N17.9] Acute renal failure, unspecified acute renal failure type (HCC) [N17.9] Patient Active Problem List   Diagnosis Date Noted   Acute metabolic encephalopathy 07/16/2021   AKI (acute kidney injury) (HCC) 07/15/2021   PCP:  Laura Kluver, FNP Pharmacy:  No Pharmacies Listed    Social Determinants of Health (SDOH) Interventions    Readmission Risk Interventions     No data to display

## 2021-07-19 ENCOUNTER — Other Ambulatory Visit (HOSPITAL_COMMUNITY): Payer: Self-pay

## 2021-07-19 LAB — CBC
HCT: 32.6 % — ABNORMAL LOW (ref 36.0–46.0)
Hemoglobin: 10.4 g/dL — ABNORMAL LOW (ref 12.0–15.0)
MCH: 28 pg (ref 26.0–34.0)
MCHC: 31.9 g/dL (ref 30.0–36.0)
MCV: 87.9 fL (ref 80.0–100.0)
Platelets: 132 10*3/uL — ABNORMAL LOW (ref 150–400)
RBC: 3.71 MIL/uL — ABNORMAL LOW (ref 3.87–5.11)
RDW: 14 % (ref 11.5–15.5)
WBC: 6.4 10*3/uL (ref 4.0–10.5)
nRBC: 0 % (ref 0.0–0.2)

## 2021-07-19 LAB — GLUCOSE, CAPILLARY
Glucose-Capillary: 135 mg/dL — ABNORMAL HIGH (ref 70–99)
Glucose-Capillary: 150 mg/dL — ABNORMAL HIGH (ref 70–99)
Glucose-Capillary: 153 mg/dL — ABNORMAL HIGH (ref 70–99)
Glucose-Capillary: 166 mg/dL — ABNORMAL HIGH (ref 70–99)
Glucose-Capillary: 170 mg/dL — ABNORMAL HIGH (ref 70–99)

## 2021-07-19 LAB — BASIC METABOLIC PANEL
Anion gap: 11 (ref 5–15)
BUN: 70 mg/dL — ABNORMAL HIGH (ref 8–23)
CO2: 23 mmol/L (ref 22–32)
Calcium: 9.7 mg/dL (ref 8.9–10.3)
Chloride: 102 mmol/L (ref 98–111)
Creatinine, Ser: 3.35 mg/dL — ABNORMAL HIGH (ref 0.44–1.00)
GFR, Estimated: 12 mL/min — ABNORMAL LOW (ref >60)
Glucose, Bld: 147 mg/dL — ABNORMAL HIGH (ref 70–99)
Potassium: 4.2 mmol/L (ref 3.5–5.1)
Sodium: 136 mmol/L (ref 135–145)

## 2021-07-19 LAB — URINE CULTURE: Culture: >=100000 — AB

## 2021-07-19 MED ORDER — APIXABAN 2.5 MG PO TABS: 2.5000 mg | ORAL_TABLET | Freq: Two times a day (BID) | ORAL | 0 refills | Status: DC

## 2021-07-19 MED ORDER — ONDANSETRON 8 MG PO TBDP: 8.0000 mg | ORAL_TABLET | Freq: Three times a day (TID) | ORAL | 0 refills | Status: AC | PRN

## 2021-07-19 MED ORDER — CEPHALEXIN 250 MG PO CAPS: 250.0000 mg | ORAL_CAPSULE | Freq: Two times a day (BID) | ORAL | 0 refills | Status: AC

## 2021-07-19 MED ORDER — LOPERAMIDE HCL 2 MG PO TABS: 1.0000 mg | ORAL_TABLET | ORAL | 0 refills | Status: AC

## 2021-07-19 MED ORDER — CEFDINIR 300 MG PO CAPS
300.0000 mg | ORAL_CAPSULE | Freq: Every day | ORAL | Status: DC
  Filled 2021-07-19: qty 1

## 2021-07-19 MED ORDER — APIXABAN 2.5 MG PO TABS: 2.5000 mg | ORAL_TABLET | Freq: Two times a day (BID) | ORAL | 0 refills | Status: AC

## 2021-07-19 MED ORDER — CEPHALEXIN 250 MG PO CAPS: 250.0000 mg | ORAL_CAPSULE | Freq: Two times a day (BID) | ORAL | 0 refills | Status: DC

## 2021-07-19 MED ORDER — SERTRALINE HCL 25 MG PO TABS: 25.0000 mg | ORAL_TABLET | Freq: Every morning | ORAL | 0 refills | Status: AC

## 2021-07-19 MED ORDER — CEPHALEXIN 250 MG PO CAPS
250.0000 mg | ORAL_CAPSULE | Freq: Two times a day (BID) | ORAL | Status: DC
  Administered 2021-07-19: 250 mg via ORAL
  Filled 2021-07-19 (×2): qty 1

## 2021-07-19 NOTE — TOC Transition Note (Addendum)
Transition of Care [facility]) - CM/SW Discharge Note   Patient Details  Name: Laura George MRN: [id number] Date of Birth: 10/21/1925  [facility]) CM/SW Contact:  Tom-Johnson, Hershal Coria, RN Phone Number: 07/19/2021, 12:52 PM   Clinical Narrative:     Patient is scheduled for discharge today. CM consulted for Home health at ALF. Patient is active with [facility] for wound care and would like to use their services for PT. CM notified Brandi and acceptance voiced. Info on AVS.  Benefit check done for Eliquis. Patient does not have prescription coverage. Free 30-day trial coupon and a $10 co-pay card given to patient. Patient will be returning back to [facility]. No further CM needs noted.   Final next level of care: Assisted Living Barriers to Discharge: Barriers Resolved   Patient Goals and CMS Choice   CMS Medicare.gov Compare Post Acute Care list provided to:: Patient Represenative (must comment) Choice offered to / list presented to : Adult Children  Discharge Placement              Patient chooses bed at:  [facility]) Patient to be transferred to facility by: [facility] Name of family member notified: Ripoll,ted (Son)   [phone] Patient and family notified of of transfer: 07/19/21  Discharge Plan and Services                                     Social Determinants of Health (SDOH) Interventions     Readmission Risk Interventions     No data to display

## 2021-07-20 LAB — CULTURE, BLOOD (ROUTINE X 2)
Culture: NO GROWTH
Culture: NO GROWTH
Special Requests: ADEQUATE

## 2022-03-28 DEATH — deceased

## 2023-07-02 IMAGING — CT CT HEAD W/O CM
3 series · 16 of 47 positions shown, 19 images · non-contrast
Comparison: None Available.

CLINICAL DATA: Altered mental status.



[Series 3: head 5.0 h30s · axial · 0.38mm/px · z∈[-100,+35]mm · 10 of 33 slices shown, 13 images]
[im 3/33  brain]
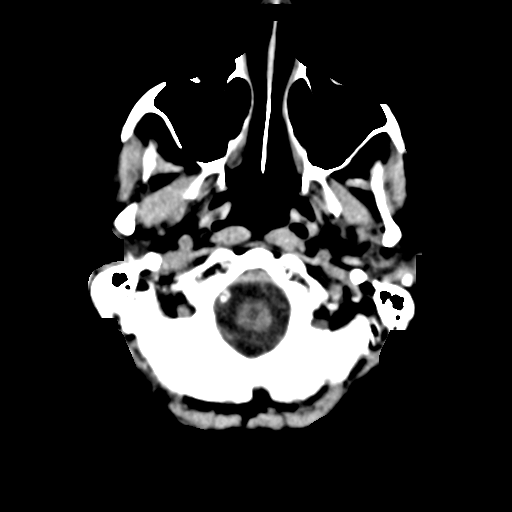
[im 3/33  bone]
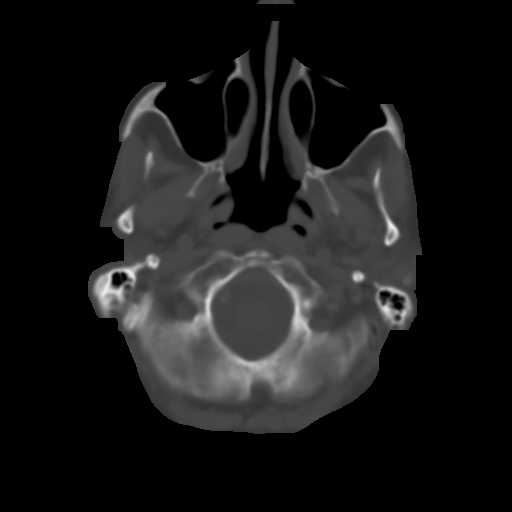
[im 6/33  brain]
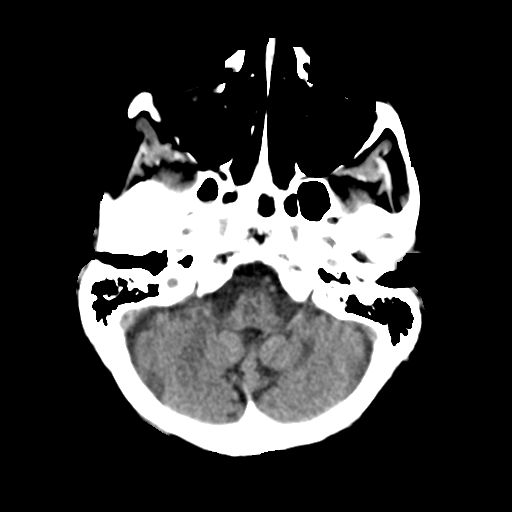
[im 9/33  brain]
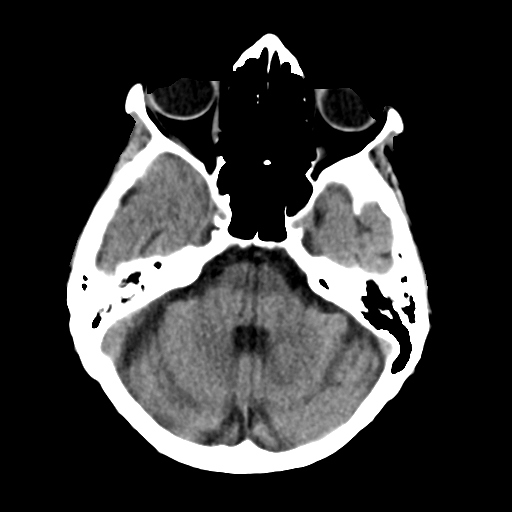
[im 12/33  brain]
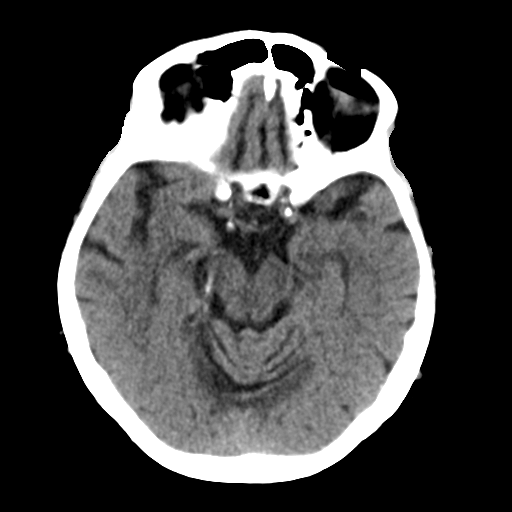
[im 15/33  brain]
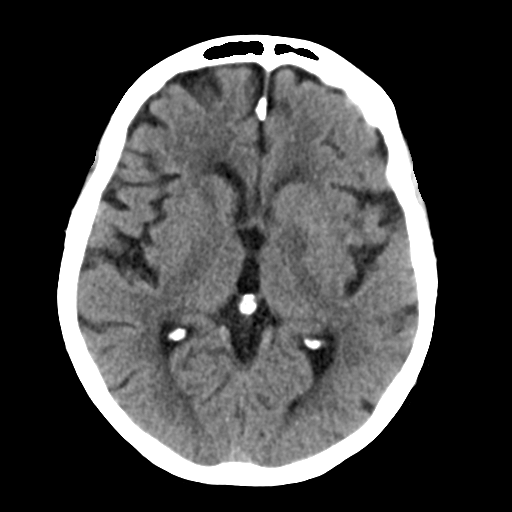
[im 15/33  bone]
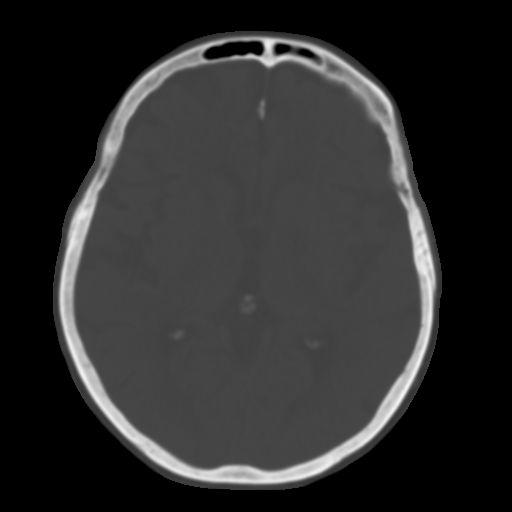
[im 18/33  brain]
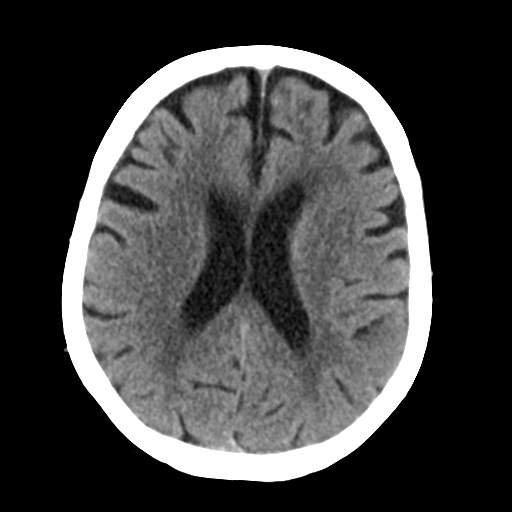
[im 21/33  brain]
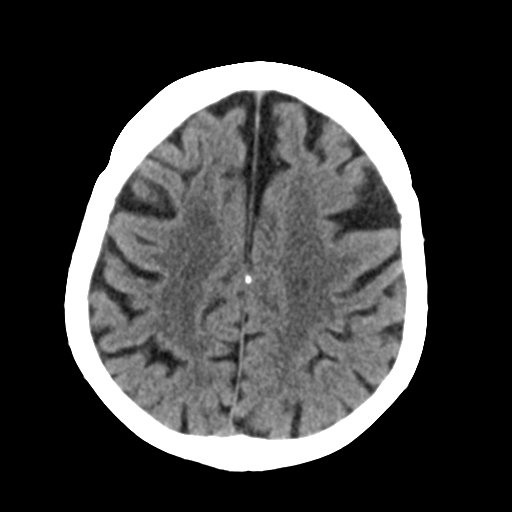
[im 25/33  brain]
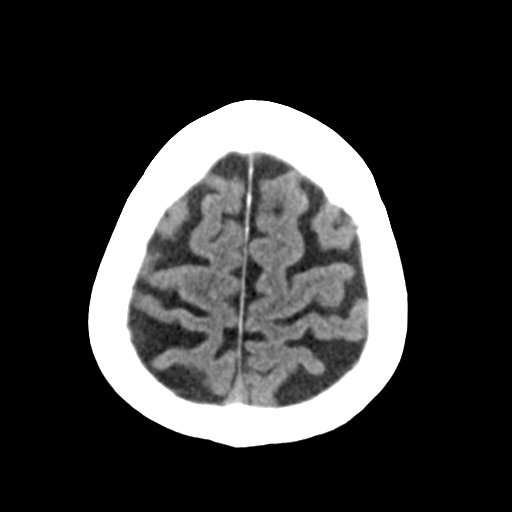
[im 27/33  brain]
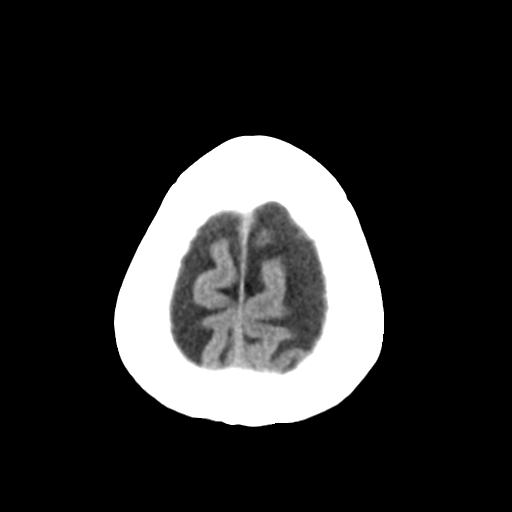
[im 27/33  bone]
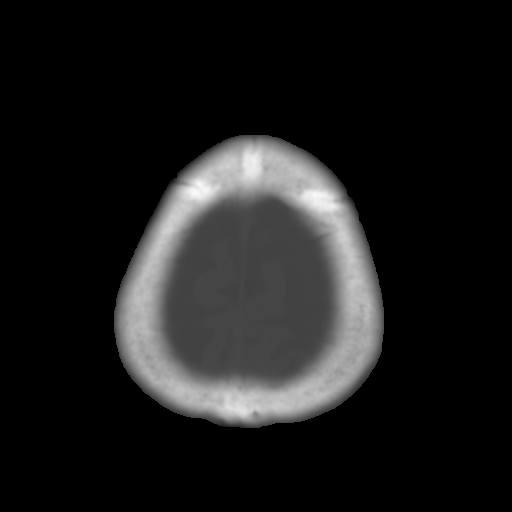
[im 30/33  brain]
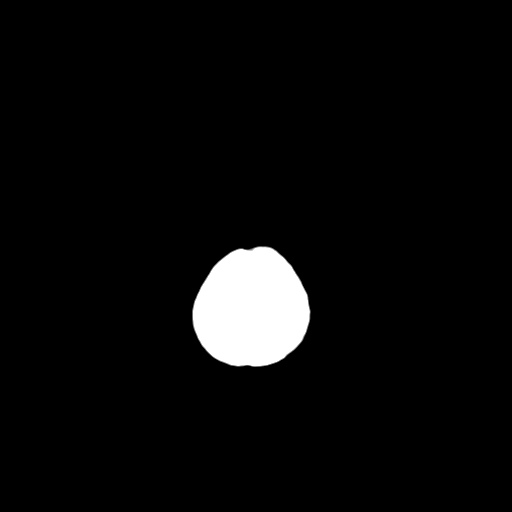

[Series 5: head 3.0 mpr cor · coronal · 0.31mm/px · 3 of 66 slices shown]
[im 22/66  brain]
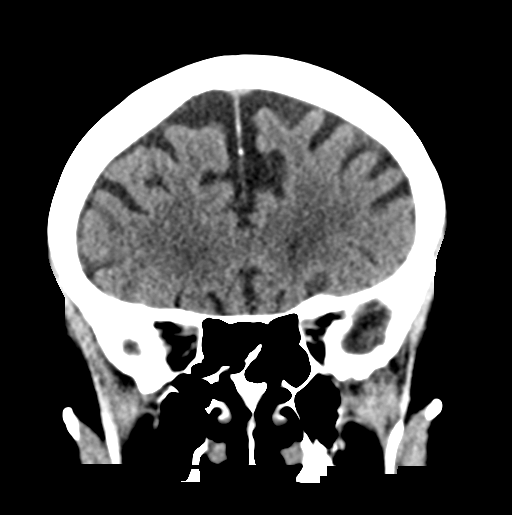
[im 29/66  brain]
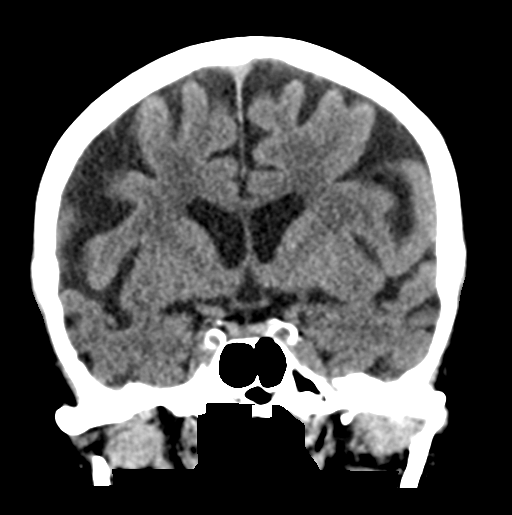
[im 37/66  brain]
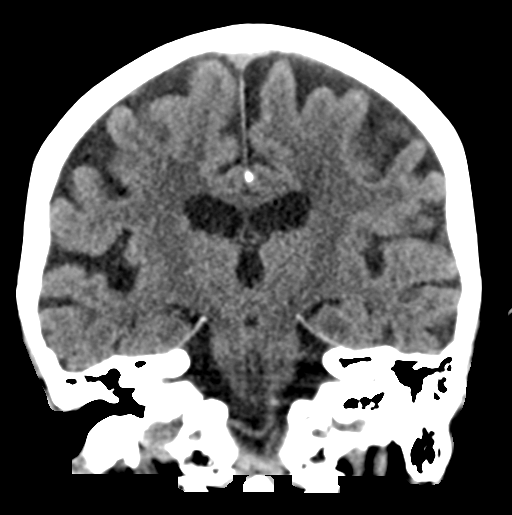

[Series 6: head 3.0 mpr sag · sagittal · 0.32mm/px · 3 of 56 slices shown]
[im 19/56  brain]
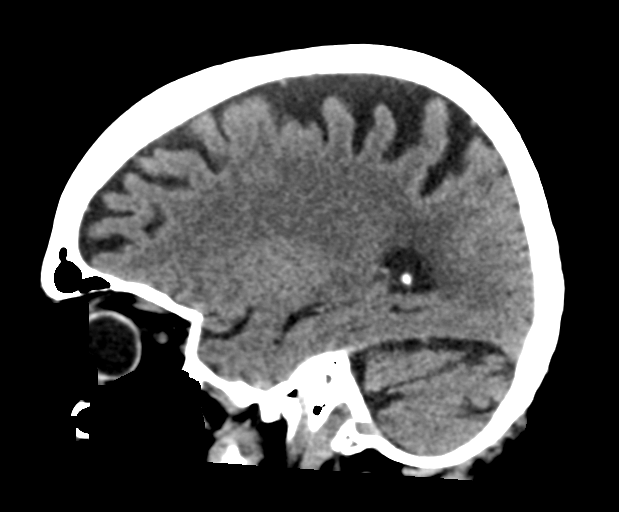
[im 28/56  brain]
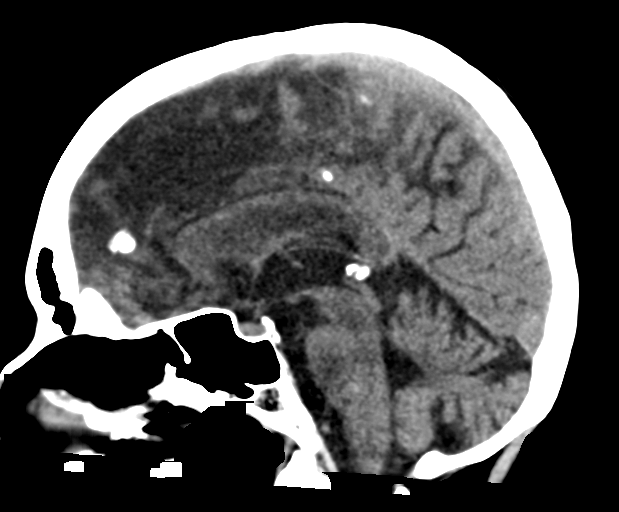
[im 37/56  brain]
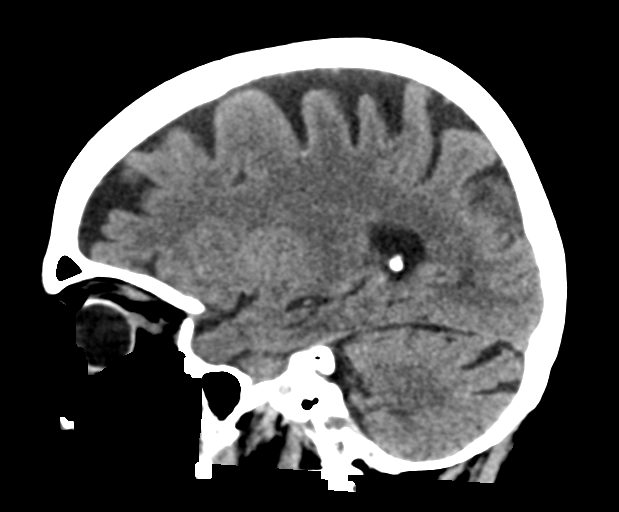

[16 of 47 positions shown; findings below may reference images not displayed]

FINDINGS: Brain: No evidence of acute infarction, hemorrhage, hydrocephalus,
extra-axial collection or mass lesion/mass effect.

There is mild diffuse low-attenuation within the subcortical and
periventricular white matter compatible with chronic microvascular
disease.

Prominence of the sulci and ventricles compatible with brain
atrophy.

Vascular: No hyperdense vessel or unexpected calcification.

Skull: Normal. Negative for fracture or focal lesion.

Sinuses/Orbits: No acute finding.

Other: None
IMPRESSION: 1. No acute intracranial abnormalities.
2. Chronic small vessel ischemic disease and brain atrophy.

## 2023-07-02 IMAGING — US US RENAL
1 series · 14 of 25 positions shown · non-contrast
Comparison: None Available.

CLINICAL DATA: Acute kidney failure.

EXAM:
RENAL / URINARY TRACT ULTRASOUND COMPLETE

[Series 1: us renal · 14 of 50 slices shown]
[im 1/50]
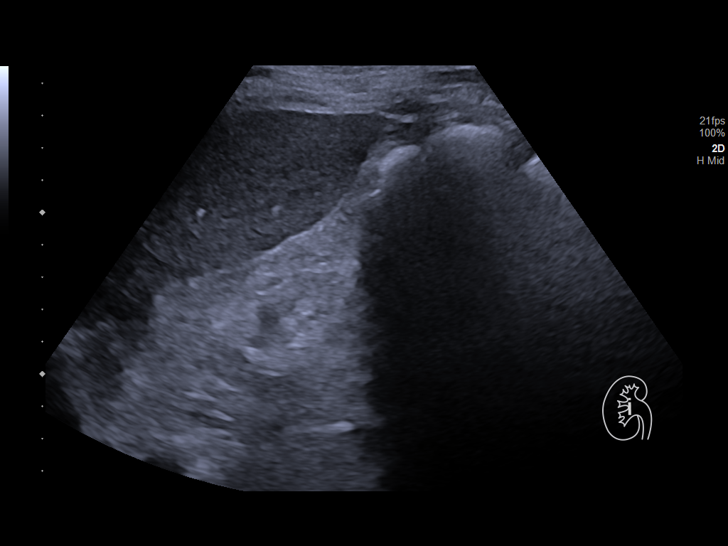
[im 5/50]
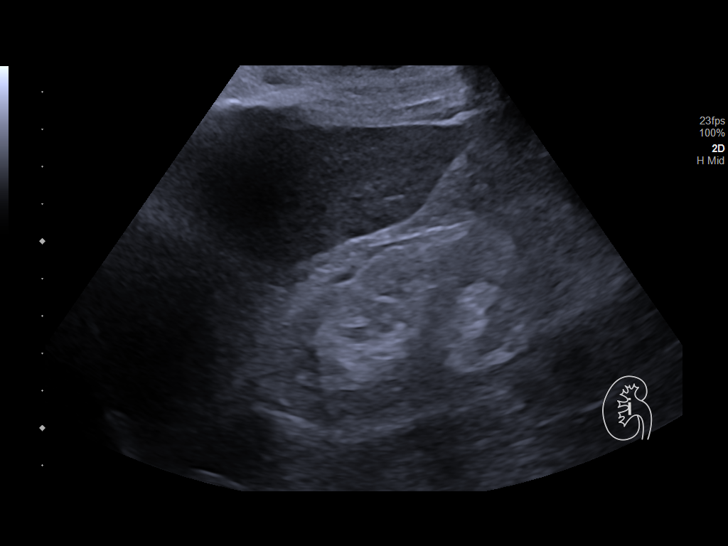
[im 9/50]
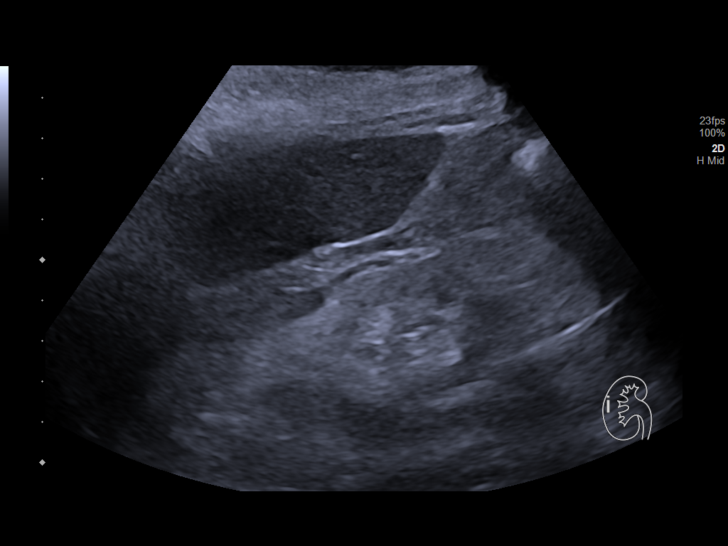
[im 13/50]
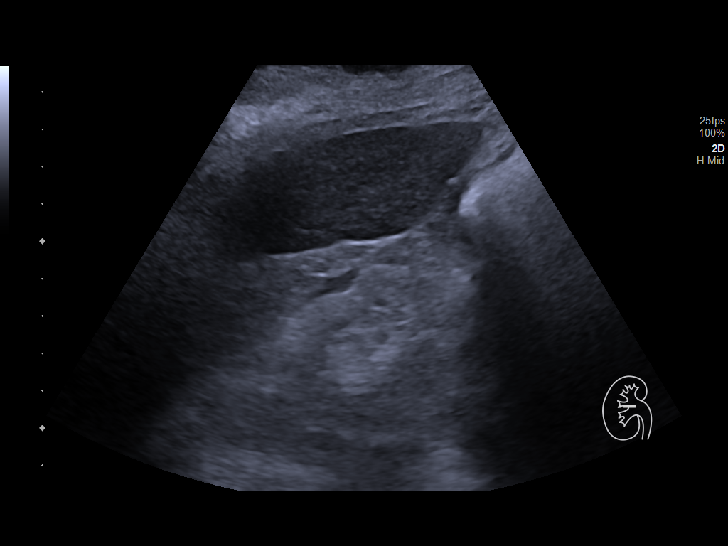
[im 17/50]
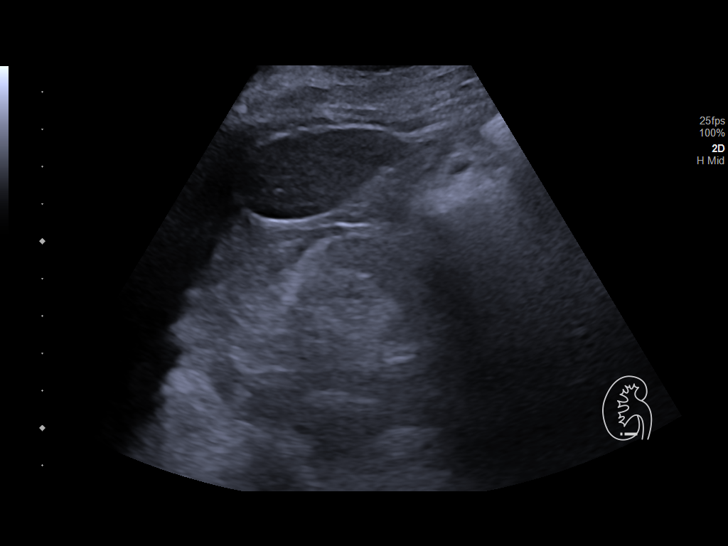
[im 19/50]
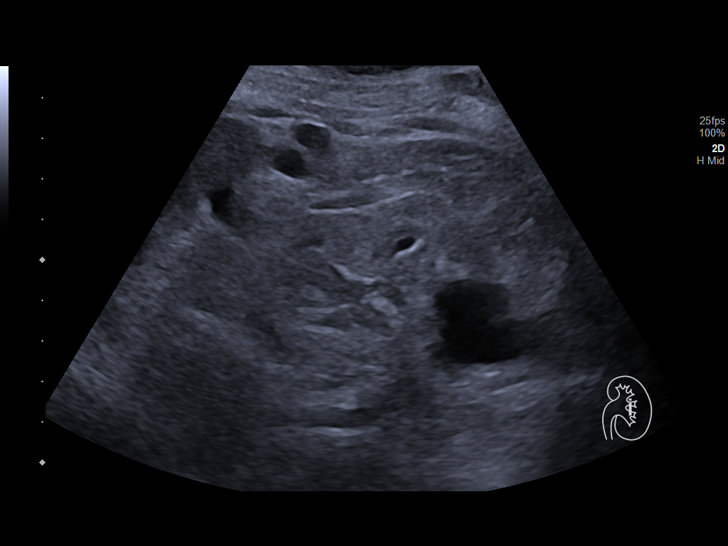
[im 23/50]
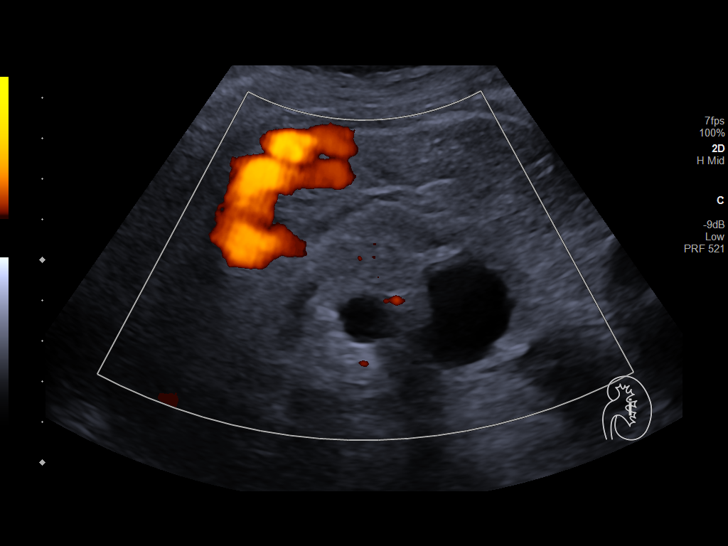
[im 27/50]
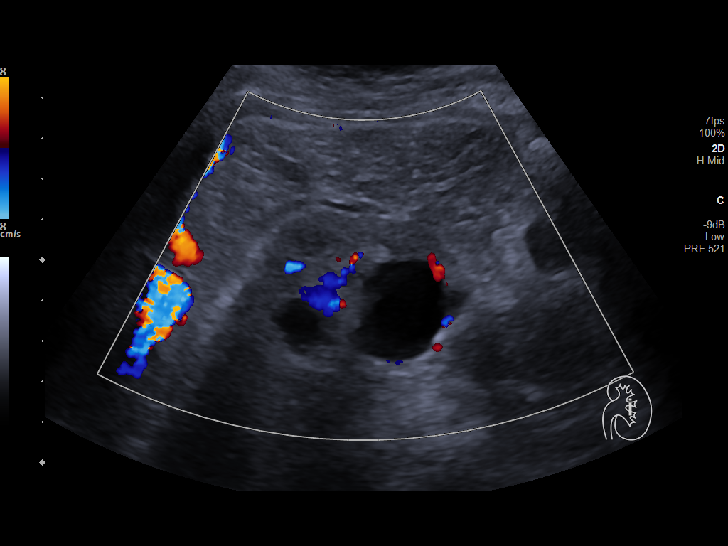
[im 31/50]
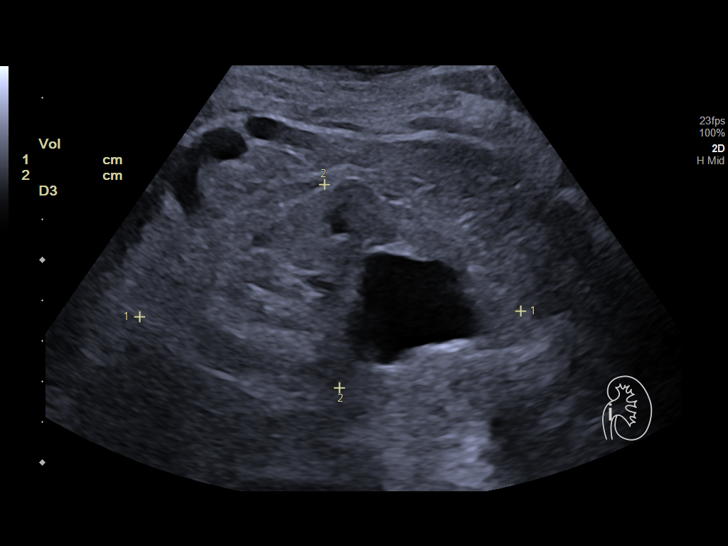
[im 33/50]
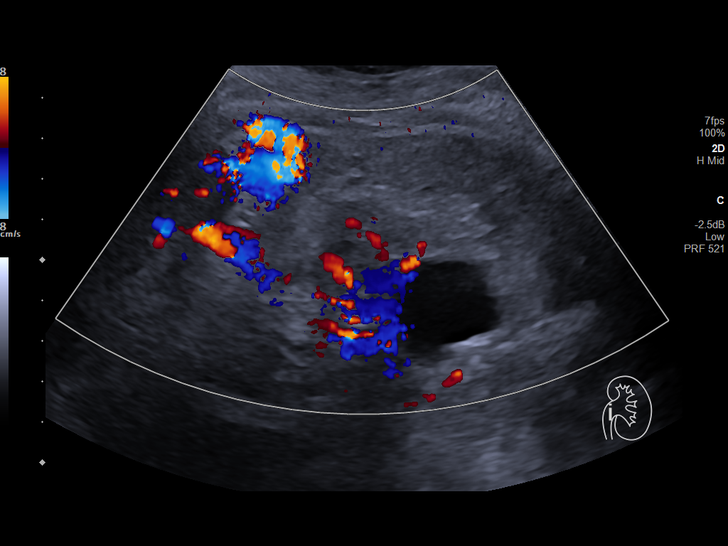
[im 37/50]
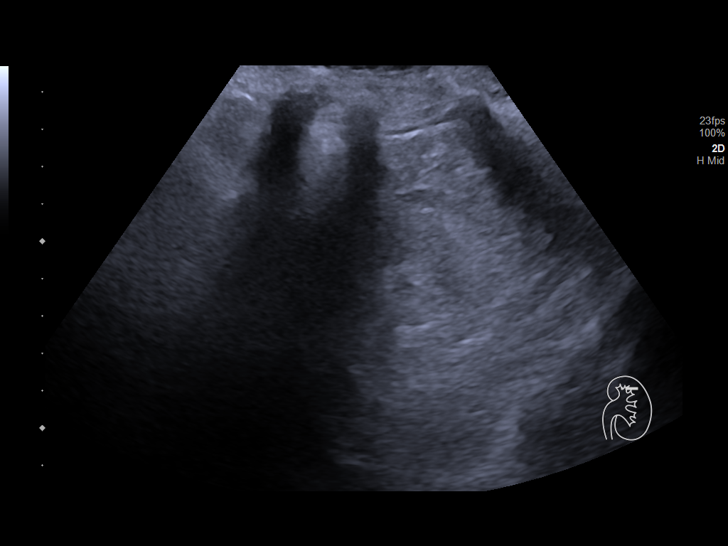
[im 41/50]
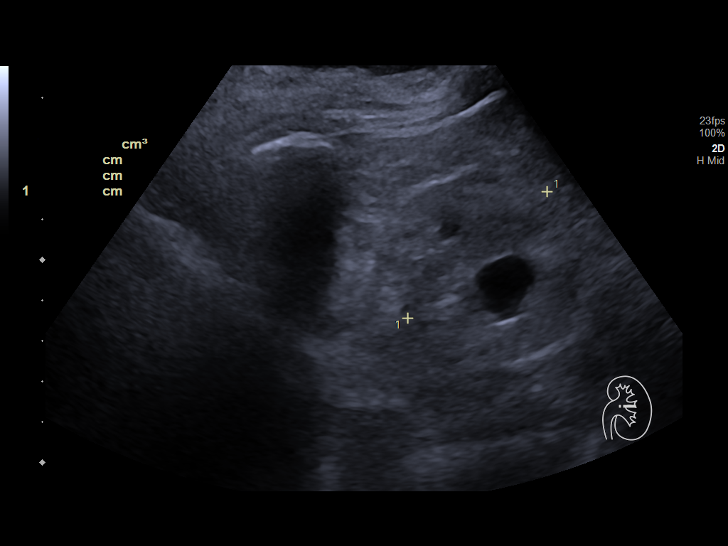
[im 45/50]
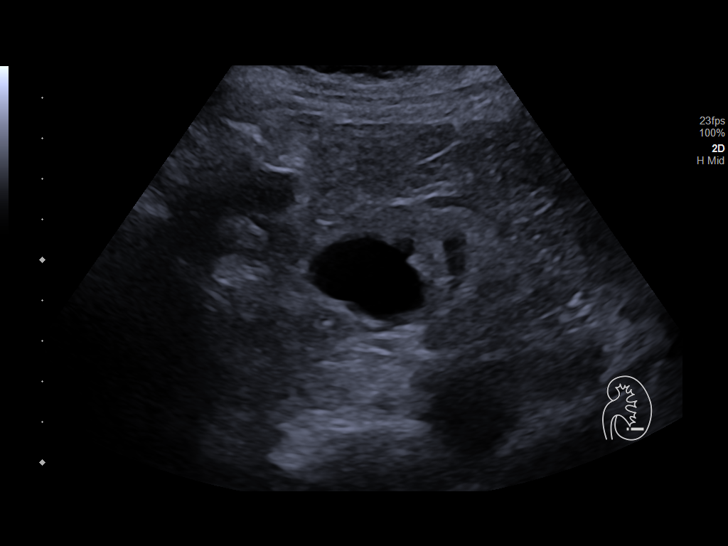
[im 50/50]
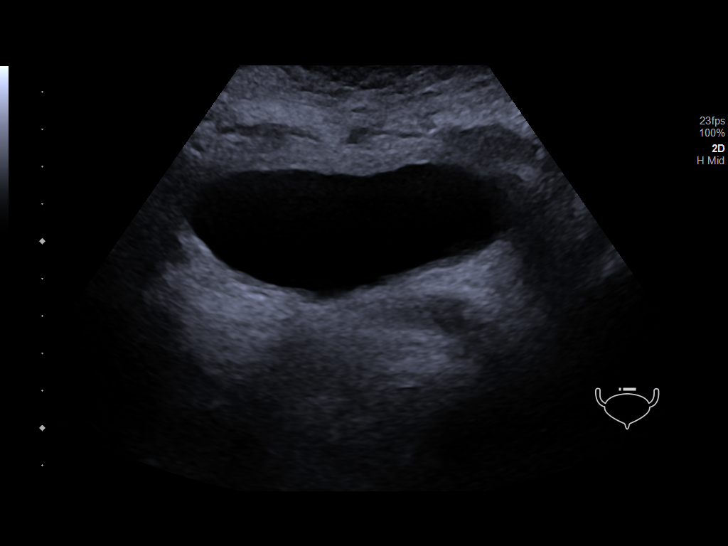

[14 of 25 positions shown; findings below may reference images not displayed]

FINDINGS: Right Kidney:

Renal measurements: 8 x 4.6 x 4 5 cm = volume: 87 mL. Diffuse
increased echotexture of the kidney. No mass or hydronephrosis
visualized. Perinephric fluid is noted.

Left Kidney:

Renal measurements: 9.4 x 5 x 4.7 cm = volume: 115 mL. Diffuse
increased echotexture of the kidney. No hydronephrosis visualized.
There are less than 10 simple cysts identified in the left kidney,
largest measures 2.6 x 2.2 x 2.7 cm in the lower pole left kidney.
No follow-up is necessary. Perinephric fluid is noted.

Bladder:

Appears normal for degree of bladder distention.

Other:

None.
IMPRESSION: No acute abnormality. No hydronephrosis bilaterally. Diffuse
increased echotexture of the kidney this can be seen in medical
renal disease.
